# Patient Record
Sex: Female | Born: 1980
Health system: Southern US, Community
[De-identification: ages and names within clinical notes are randomized; demographics above are authoritative.]

## PROBLEM LIST (undated history)

## (undated) DIAGNOSIS — O1420 HELLP syndrome (HELLP), unspecified trimester: Secondary | ICD-10-CM

## (undated) DIAGNOSIS — B019 Varicella without complication: Secondary | ICD-10-CM

## (undated) DIAGNOSIS — J45909 Unspecified asthma, uncomplicated: Secondary | ICD-10-CM

## (undated) HISTORY — DX: HELLP syndrome (HELLP), unspecified trimester: O14.20

## (undated) HISTORY — DX: Unspecified asthma, uncomplicated: J45.909

## (undated) HISTORY — DX: Varicella without complication: B01.9

---

## 1999-03-14 ENCOUNTER — Other Ambulatory Visit: Admission: RE | Admit: 1999-03-14 | Discharge: 1999-03-14 | Payer: Self-pay | Admitting: Family Medicine

## 2000-03-19 ENCOUNTER — Other Ambulatory Visit: Admission: RE | Admit: 2000-03-19 | Discharge: 2000-03-19 | Payer: Self-pay | Admitting: Family Medicine

## 2001-03-25 ENCOUNTER — Other Ambulatory Visit: Admission: RE | Admit: 2001-03-25 | Discharge: 2001-03-25 | Payer: Self-pay | Admitting: *Deleted

## 2002-01-14 ENCOUNTER — Encounter: Payer: Self-pay | Admitting: Primary Care

## 2002-03-17 ENCOUNTER — Other Ambulatory Visit: Admission: RE | Admit: 2002-03-17 | Discharge: 2002-03-17 | Payer: Self-pay | Admitting: *Deleted

## 2003-04-07 ENCOUNTER — Other Ambulatory Visit: Admission: RE | Admit: 2003-04-07 | Discharge: 2003-04-07 | Payer: Self-pay | Admitting: *Deleted

## 2004-03-30 ENCOUNTER — Other Ambulatory Visit: Admission: RE | Admit: 2004-03-30 | Discharge: 2004-03-30 | Payer: Self-pay | Admitting: *Deleted

## 2005-03-13 ENCOUNTER — Other Ambulatory Visit: Admission: RE | Admit: 2005-03-13 | Discharge: 2005-03-13 | Payer: Self-pay | Admitting: *Deleted

## 2006-05-24 ENCOUNTER — Other Ambulatory Visit: Admission: RE | Admit: 2006-05-24 | Discharge: 2006-05-24 | Payer: Self-pay | Admitting: Obstetrics and Gynecology

## 2006-11-26 ENCOUNTER — Inpatient Hospital Stay (HOSPITAL_COMMUNITY): Admission: AD | Admit: 2006-11-26 | Discharge: 2006-11-30 | Payer: Self-pay | Admitting: Obstetrics and Gynecology

## 2006-12-01 ENCOUNTER — Encounter: Admission: RE | Admit: 2006-12-01 | Discharge: 2006-12-25 | Payer: Self-pay | Admitting: Obstetrics and Gynecology

## 2007-06-23 ENCOUNTER — Other Ambulatory Visit: Admission: RE | Admit: 2007-06-23 | Discharge: 2007-06-23 | Payer: Self-pay | Admitting: *Deleted

## 2008-09-10 ENCOUNTER — Other Ambulatory Visit: Admission: RE | Admit: 2008-09-10 | Discharge: 2008-09-10 | Payer: Self-pay | Admitting: Gynecology

## 2010-05-22 ENCOUNTER — Inpatient Hospital Stay (HOSPITAL_COMMUNITY): Admission: AD | Admit: 2010-05-22 | Discharge: 2010-05-24 | Payer: Self-pay | Admitting: Obstetrics and Gynecology

## 2010-05-23 ENCOUNTER — Encounter: Payer: Self-pay | Admitting: Obstetrics and Gynecology

## 2010-05-25 ENCOUNTER — Inpatient Hospital Stay: Admission: AD | Admit: 2010-05-25 | Discharge: 2010-05-25 | Payer: Self-pay | Admitting: Obstetrics and Gynecology

## 2010-06-08 ENCOUNTER — Encounter (INDEPENDENT_AMBULATORY_CARE_PROVIDER_SITE_OTHER): Payer: Self-pay | Admitting: Obstetrics and Gynecology

## 2010-06-08 ENCOUNTER — Inpatient Hospital Stay (HOSPITAL_COMMUNITY): Admission: AD | Admit: 2010-06-08 | Discharge: 2010-06-13 | Payer: Self-pay | Admitting: Obstetrics and Gynecology

## 2010-10-25 LAB — COMPREHENSIVE METABOLIC PANEL
ALT: 10 U/L (ref 0–35)
ALT: 19 U/L (ref 0–35)
ALT: 8 U/L (ref 0–35)
AST: 16 U/L (ref 0–37)
AST: 26 U/L (ref 0–37)
Albumin: 2.6 g/dL — ABNORMAL LOW (ref 3.5–5.2)
Albumin: 2.8 g/dL — ABNORMAL LOW (ref 3.5–5.2)
Alkaline Phosphatase: 107 U/L (ref 39–117)
Alkaline Phosphatase: 153 U/L — ABNORMAL HIGH (ref 39–117)
CO2: 25 mEq/L (ref 19–32)
Calcium: 8.2 mg/dL — ABNORMAL LOW (ref 8.4–10.5)
GFR calc Af Amer: >60 mL/min (ref >60)
GFR calc non Af Amer: >60 mL/min (ref >60)
Glucose, Bld: 107 mg/dL — ABNORMAL HIGH (ref 70–99)
Glucose, Bld: 87 mg/dL (ref 70–99)
Potassium: 4.3 mEq/L (ref 3.5–5.1)
Potassium: 4.5 mEq/L (ref 3.5–5.1)
Sodium: 130 mEq/L — ABNORMAL LOW (ref 135–145)
Sodium: 131 mEq/L — ABNORMAL LOW (ref 135–145)
Sodium: 135 mEq/L (ref 135–145)
Total Protein: 5.9 g/dL — ABNORMAL LOW (ref 6.0–8.3)
Total Protein: 6.4 g/dL (ref 6.0–8.3)

## 2010-10-25 LAB — CBC
HCT: 30 % — ABNORMAL LOW (ref 36.0–46.0)
HCT: 31.9 % — ABNORMAL LOW (ref 36.0–46.0)
HCT: 36.9 % (ref 36.0–46.0)
Hemoglobin: 10.1 g/dL — ABNORMAL LOW (ref 12.0–15.0)
MCH: 30.4 pg (ref 26.0–34.0)
MCHC: 33.8 g/dL (ref 30.0–36.0)
Platelets: 273 10*3/uL (ref 150–400)
RBC: 4.13 MIL/uL (ref 3.87–5.11)
RDW: 14.1 % (ref 11.5–15.5)
RDW: 14.1 % (ref 11.5–15.5)
WBC: 7.9 10*3/uL (ref 4.0–10.5)
WBC: 8.7 10*3/uL (ref 4.0–10.5)

## 2010-10-25 LAB — RPR: RPR Ser Ql: NONREACTIVE

## 2010-10-26 LAB — COMPREHENSIVE METABOLIC PANEL
ALT: 13 U/L (ref 0–35)
AST: 19 U/L (ref 0–37)
AST: 24 U/L (ref 0–37)
Albumin: 2.3 g/dL — ABNORMAL LOW (ref 3.5–5.2)
Albumin: 2.3 g/dL — ABNORMAL LOW (ref 3.5–5.2)
Alkaline Phosphatase: 110 U/L (ref 39–117)
Alkaline Phosphatase: 133 U/L — ABNORMAL HIGH (ref 39–117)
BUN: 6 mg/dL (ref 6–23)
BUN: 8 mg/dL (ref 6–23)
CO2: 21 mEq/L (ref 19–32)
CO2: 22 mEq/L (ref 19–32)
Calcium: 7.4 mg/dL — ABNORMAL LOW (ref 8.4–10.5)
Calcium: 9.4 mg/dL (ref 8.4–10.5)
Chloride: 106 mEq/L (ref 96–112)
Chloride: 107 mEq/L (ref 96–112)
Creatinine, Ser: 0.71 mg/dL (ref 0.4–1.2)
GFR calc Af Amer: >60 mL/min (ref >60)
GFR calc Af Amer: >60 mL/min (ref >60)
GFR calc non Af Amer: >60 mL/min (ref >60)
GFR calc non Af Amer: >60 mL/min (ref >60)
Glucose, Bld: 101 mg/dL — ABNORMAL HIGH (ref 70–99)
Potassium: 3.8 mEq/L (ref 3.5–5.1)
Sodium: 136 mEq/L (ref 135–145)
Total Bilirubin: 0.2 mg/dL — ABNORMAL LOW (ref 0.3–1.2)
Total Bilirubin: 0.4 mg/dL (ref 0.3–1.2)
Total Protein: 5.4 g/dL — ABNORMAL LOW (ref 6.0–8.3)

## 2010-10-26 LAB — URIC ACID
Uric Acid, Serum: 3.4 mg/dL (ref 2.4–7.0)
Uric Acid, Serum: 3.9 mg/dL (ref 2.4–7.0)

## 2010-10-26 LAB — COMPREHENSIVE METABOLIC PANEL WITH GFR
ALT: 14 U/L (ref 0–35)
AST: 20 U/L (ref 0–37)
Albumin: 2.5 g/dL — ABNORMAL LOW (ref 3.5–5.2)
Alkaline Phosphatase: 123 U/L — ABNORMAL HIGH (ref 39–117)
BUN: 6 mg/dL (ref 6–23)
CO2: 22 meq/L (ref 19–32)
Calcium: 7.8 mg/dL — ABNORMAL LOW (ref 8.4–10.5)
Chloride: 104 meq/L (ref 96–112)
Creatinine, Ser: 0.64 mg/dL (ref 0.4–1.2)
GFR calc non Af Amer: >60 mL/min
Glucose, Bld: 93 mg/dL (ref 70–99)
Potassium: 4 meq/L (ref 3.5–5.1)
Sodium: 133 meq/L — ABNORMAL LOW (ref 135–145)
Total Bilirubin: 0.4 mg/dL (ref 0.3–1.2)
Total Protein: 5.6 g/dL — ABNORMAL LOW (ref 6.0–8.3)

## 2010-10-26 LAB — CBC
HCT: 30.4 % — ABNORMAL LOW (ref 36.0–46.0)
HCT: 35.3 % — ABNORMAL LOW (ref 36.0–46.0)
HCT: 35.8 % — ABNORMAL LOW (ref 36.0–46.0)
Hemoglobin: 10.2 g/dL — ABNORMAL LOW (ref 12.0–15.0)
Hemoglobin: 10.3 g/dL — ABNORMAL LOW (ref 12.0–15.0)
Hemoglobin: 11.6 g/dL — ABNORMAL LOW (ref 12.0–15.0)
Hemoglobin: 11.9 g/dL — ABNORMAL LOW (ref 12.0–15.0)
MCH: 29.7 pg (ref 26.0–34.0)
MCH: 29.9 pg (ref 26.0–34.0)
MCH: 30.2 pg (ref 26.0–34.0)
MCHC: 32.8 g/dL (ref 30.0–36.0)
MCHC: 33.3 g/dL (ref 30.0–36.0)
MCHC: 33.5 g/dL (ref 30.0–36.0)
MCV: 89.4 fL (ref 78.0–100.0)
MCV: 90.2 fL (ref 78.0–100.0)
MCV: 91.3 fL (ref 78.0–100.0)
Platelets: 147 10*3/uL — ABNORMAL LOW (ref 150–400)
Platelets: 161 10*3/uL (ref 150–400)
Platelets: 190 10*3/uL (ref 150–400)
Platelets: 190 10*3/uL (ref 150–400)
RBC: 3.36 MIL/uL — ABNORMAL LOW (ref 3.87–5.11)
RBC: 3.37 MIL/uL — ABNORMAL LOW (ref 3.87–5.11)
RBC: 3.87 MIL/uL (ref 3.87–5.11)
RBC: 4.01 MIL/uL (ref 3.87–5.11)
RDW: 13.5 % (ref 11.5–15.5)
RDW: 13.5 % (ref 11.5–15.5)
RDW: 13.6 % (ref 11.5–15.5)
WBC: 11.9 10*3/uL — ABNORMAL HIGH (ref 4.0–10.5)
WBC: 13.6 10*3/uL — ABNORMAL HIGH (ref 4.0–10.5)
WBC: 13.6 10*3/uL — ABNORMAL HIGH (ref 4.0–10.5)
WBC: 9.2 10*3/uL (ref 4.0–10.5)

## 2010-10-26 LAB — CREATININE CLEARANCE, URINE, 24 HOUR
Creatinine Clearance: 144 mL/min — ABNORMAL HIGH (ref 75–115)
Creatinine, 24H Ur: 1325 mg/d (ref 700–1800)
Creatinine, Urine: 25 mg/dL
Creatinine: 0.64 mg/dL (ref 0.4–1.2)

## 2010-10-26 LAB — STREP B DNA PROBE: Strep Group B Ag: NEGATIVE

## 2010-10-26 LAB — LACTATE DEHYDROGENASE
LDH: 140 U/L (ref 94–250)
LDH: 92 U/L — ABNORMAL LOW (ref 94–250)

## 2010-10-26 LAB — PROTEIN, URINE, 24 HOUR: Urine Total Volume-UPROT: 5300 mL

## 2010-10-26 LAB — MAGNESIUM: Magnesium: 5.2 mg/dL — ABNORMAL HIGH (ref 1.5–2.5)

## 2010-10-26 LAB — SAMPLE TO BLOOD BANK

## 2010-12-29 NOTE — Discharge Summary (Signed)
Carolyn Watson, MARTORANO              ACCOUNT NO.:  [id number]   MEDICAL RECORD NO.:  [id number]          PATIENT TYPE:  INP   LOCATION:  9131                          FACILITY:  [facility]   PHYSICIAN:  Malachi Pro. Ambrose Mantle, M.D. DATE OF BIRTH:  05/24/1981   DATE OF ADMISSION:  11/26/2006  DATE OF DISCHARGE:  11/30/2006                               DISCHARGE SUMMARY   This is a 30 year old white female, para 0 gravida 1 at [redacted] weeks  gestation, [facility] Dec 26, 2006.  Last menstrual period compatible with a 9-  week ultrasound, admitted with preeclampsia, HELLP syndrome for  delivery.  The patient has had borderline blood pressures for the last  few weeks 140/90s but negative proteinuria.  Today noted increased  epigastric pain that actually began 3 days ago and a mild headache.  Blood pressure was 160/100 and 140/90 and 2+ proteinuria.  Labs at the  hospital were compatible with HELLP syndrome with platelets of 80,000  and liver function tests SGOT 217, SGPT 145.  Blood group and type A+,  antibody negative, RPR nonreactive, rubella nonimmune, hepatitis B  surface antigen negative, HIV and GC negative, chlamydia negative, group  B strep negative.   OBSTETRIC SURGICAL AN MEDICAL HISTORY:  Is negative.  Apparently she has  ALLERGIC ASTHMA .  She has no known drug allergies.  No medications.   On admission her blood pressure 160/102.  Fetal heart tones were  reassuring.  HEART/LUNGS:  Were normal.  ABDOMEN:  Was soft.  CERVIX: Was 1+ centimeters, 50%.  The patient was begun on magnesium sulfate for seizure prophylaxis and  Pitocin to induce labor.  By 9:30 p.m. the patient had some discomfort  with contractions.  Blood pressure was 160/105.  Fetal heart tones were  reassuring.  Dexamethasone was given.  By 11:15 p.m. the cervix was 2 cm  - 3 cm, 80% but the labs had deteriorated.  Her platelet count was  69,000, SGOT was 304, SGPT 182.  Dr. Senaida Ores offered the patient to  go for C-section and  she underwent a low transverse cervical C-section  by Dr. Senaida Ores under general anesthesia.  Blood loss about 700 cc,  living female infant 5 pounds 11 ounces with Apgars of 8 and 9 at 1 and  5 minutes was delivered.  Uterus, tubes and ovaries were normal.  Baby  was delivered at 11:58 p.m. on November 26, 2006.   Postoperatively the patient did well.  On the first postop day her SGPT  was 163, SGOT was 246, platelet count was 58,000.  Late on the first  postop day the patient required some labetalol for blood pressure,  magnesium sulfate was continued.  On the second postop day the patient  had diuresed well having put out 2900 cc.  On her last shift her blood  pressures 130-140/70-90s, liver function tests had decreased.  On the  third postop day the patient's SGOT was 48, SGPT 97.  The platelets were  94,000.  Her blood pressures were improved and she was ready for  discharge but the baby could not be discharged so elected  to stay.  On  the 4th postop day her blood pressures were fine.  She had no  significant complaints.  Her staples had already been removed, strips  applied and she was ready for discharge.   Her initial hemoglobin was 12.9, hematocrit 37.3, white count 10,800,  platelet count 86,000.  Follow-up hemoglobins were relatively stable.  The final hemoglobin was 9.9, platelet counts went from 86-80, 69, 58,  60, 46, 47 and 94.  Her SGOT and SGPT started at 122 and 137.  Follow-up  at 217, 145, 304, 182, 334 and 186, 246 and 163, 165, 159 and 48 and 97.  LDH was 207, 281, 326, 373, 335, 280 and 161.  Serum creatinine was  0.73, rose to a high of 0.84.  Uric acid remained within normal limits  the entire time.  Protein was 30 percent in the urine analysis.   FINAL DIAGNOSIS:  Intrauterine pregnancy at 36 weeks delivered vertex by  cesarean section, severe preeclampsia with HELLP syndrome.  Operation  low transverse cervical cesarean section.   FINAL CONDITION:   Improved.   INSTRUCTIONS:  Include our regular discharge instruction booklet.  Dr.  Senaida Ores has already given her prescriptions for ibuprofen and  Percocet and she is to return to the office in 1-2 weeks for an incision  check and blood pressure check.      Malachi Pro. Ambrose Mantle, M.D.  Electronically Signed     TFH/MEDQ  D:  11/30/2006  T:  11/30/2006  Job:  [id number]

## 2010-12-29 NOTE — Op Note (Signed)
Carolyn Watson, Carolyn Watson              ACCOUNT NO.:  [id number]   MEDICAL RECORD NO.:  [id number]          PATIENT TYPE:  INP   LOCATION:  9168                          [facility]:  [facility]   PHYSICIAN:  Huel Cote, M.D. DATE OF BIRTH:  [birth year]   DATE OF PROCEDURE:  11/27/2006  DATE OF DISCHARGE:                               OPERATIVE REPORT   PREOPERATIVE DIAGNOSES:  1. HILT syndrome preeclampsia.  2. Preterm pregnancy at 36 weeks.  3. Thrombocytopenia.   POSTOPERATIVE DIAGNOSES:  1. HILT syndrome preeclampsia.  2. Preterm pregnancy at 36 weeks.  3. Thrombocytopenia.   PROCEDURE:  Primary low transverse C-section with two-layer closure of  the uterus.   SURGEON:  Dr. Huel Cote.   ANESTHESIA:  General.   SPECIMENS:  Placenta was sent to labor and delivery.   ESTIMATED BLOOD LOSS:  700 mL.   IV FLUIDS:  1200 mL.   URINE OUTPUT:  Approximately 550 mL of clear urine.   FINDINGS:  There is a viable female infant in vertex presentation,  Apgars were 8 and 9, weight 5 pounds 11 ounces.  There is normal uterus,  tubes and ovaries noted.   PROCEDURE:  The patient was taken to the operating room after informed  consent was obtained and prepped and draped in the normal sterile  fashion in the dorsal supine position with a leftward tilt.  After a  Foley catheter was placed and the patient was prepped, she then  underwent induction with general anesthesia and an incision was begun on  the abdomen.  At this point, the patient moved slightly and it was felt  that her anesthetic was not deep enough. Therefore the operation was  halted and the anesthetic was improved.  Once this was found to be  adequate, we continued with the abdominal incision in Pfannenstiel  fashion and went down to the layer of fascia by Bovie cautery and sharp  dissection.  Fascia was then opened in the midline and the incision was  extended laterally with Mayo scissors.  The inferior aspect was  grasped  Kocher clamps, elevated and dissected off the rectus muscles, superior  aspect was elevated and dissected off the rectus muscles.  The rectus  muscles were separated midline and peritoneal incision extended both  superiorly and inferiorly with careful attention to avoid both bowel and  bladder.  The Alexis wound retractor was then placed within the incision  and lower uterine segment identified.  It was incised in transverse  fashion and the uterine cavity itself entered bluntly.  This was then  extended digitally and the infant's head was delivered atraumatically.  Nose and mouth were bulb suctioned.  The remainder of the infant  delivered, the cord clamped and infant handed to the waiting  pediatricians with a good spontaneous cry.  The placenta was then  expressed manually.  There was some mild uterine atony which responded  well to bimanual massage and Pitocin.  Uterus was cleared of all clots  and debris with moist lap sponge and the uterine incision was then  repaired in two layers, first layer a running  locked layer of 0-0  chromic, second an imbricating layer of the same suture.  At this point  there was no active bleeding noted.  The tubes and ovaries were  inspected and found to be hemostatic.  The bladder was inspected and  found to be hemostatic.  There were a couple of small areas of oozing  which were treated with Bovie cautery but no significant active bleeding  noted.  Therefore the abdomen and pelvis were irrigated and again all  appeared dry.  The rectus muscles and peritoneum were then  reapproximated with several interrupted sutures of 0-0 Vicryl.  The  fascia was closed with 0-0 Vicryl in a running fashion and skin was  closed with staples.  Sponge, lap, needle counts were correct x2 and the  patient was taken to the recovery room after she awakened in good  condition   OPERATIVE REPORT:  On the patient's of the more and thank you      Huel Cote,  M.D.  Electronically Signed     KR/MEDQ  D:  11/27/2006  T:  11/27/2006  Job:  [id number]

## 2012-12-26 ENCOUNTER — Other Ambulatory Visit: Payer: Self-pay | Admitting: Obstetrics and Gynecology

## 2013-11-10 ENCOUNTER — Other Ambulatory Visit: Payer: Self-pay | Admitting: Dermatology

## 2013-11-26 ENCOUNTER — Other Ambulatory Visit: Payer: Self-pay

## 2014-06-17 ENCOUNTER — Other Ambulatory Visit: Payer: Self-pay | Admitting: Obstetrics and Gynecology

## 2014-06-18 LAB — CYTOLOGY - PAP

## 2016-10-05 DIAGNOSIS — D2261 Melanocytic nevi of right upper limb, including shoulder: Secondary | ICD-10-CM | POA: Diagnosis not present

## 2016-10-05 DIAGNOSIS — D2262 Melanocytic nevi of left upper limb, including shoulder: Secondary | ICD-10-CM | POA: Diagnosis not present

## 2016-10-05 DIAGNOSIS — D225 Melanocytic nevi of trunk: Secondary | ICD-10-CM | POA: Diagnosis not present

## 2016-10-05 DIAGNOSIS — D2271 Melanocytic nevi of right lower limb, including hip: Secondary | ICD-10-CM | POA: Diagnosis not present

## 2016-12-11 LAB — HM PAP SMEAR: HM Pap smear: NORMAL

## 2016-12-25 DIAGNOSIS — Z6822 Body mass index (BMI) 22.0-22.9, adult: Secondary | ICD-10-CM | POA: Diagnosis not present

## 2016-12-25 DIAGNOSIS — Z01419 Encounter for gynecological examination (general) (routine) without abnormal findings: Secondary | ICD-10-CM | POA: Diagnosis not present

## 2017-10-30 DIAGNOSIS — Z131 Encounter for screening for diabetes mellitus: Secondary | ICD-10-CM | POA: Diagnosis not present

## 2017-10-30 DIAGNOSIS — Z1321 Encounter for screening for nutritional disorder: Secondary | ICD-10-CM | POA: Diagnosis not present

## 2017-10-30 DIAGNOSIS — Z13220 Encounter for screening for lipoid disorders: Secondary | ICD-10-CM | POA: Diagnosis not present

## 2017-10-30 DIAGNOSIS — Z1329 Encounter for screening for other suspected endocrine disorder: Secondary | ICD-10-CM | POA: Diagnosis not present

## 2017-11-22 ENCOUNTER — Ambulatory Visit: Payer: Self-pay | Admitting: Primary Care

## 2018-01-03 ENCOUNTER — Ambulatory Visit: Payer: Self-pay | Admitting: Primary Care

## 2018-01-24 ENCOUNTER — Encounter: Payer: Self-pay | Admitting: Primary Care

## 2018-01-24 ENCOUNTER — Ambulatory Visit: Payer: BLUE CROSS/BLUE SHIELD | Admitting: Primary Care

## 2018-01-24 VITALS — BP 116/70 | HR 70 | Temp 98.8°F | Ht 65.0 in | Wt 138.8 lb

## 2018-01-24 DIAGNOSIS — J4520 Mild intermittent asthma, uncomplicated: Secondary | ICD-10-CM | POA: Insufficient documentation

## 2018-01-24 DIAGNOSIS — J4540 Moderate persistent asthma, uncomplicated: Secondary | ICD-10-CM | POA: Diagnosis not present

## 2018-01-24 MED ORDER — FLUTICASONE PROPIONATE HFA 110 MCG/ACT IN AERO: 1.0000 | INHALATION_SPRAY | Freq: Two times a day (BID) | RESPIRATORY_TRACT | 5 refills | Status: DC

## 2018-01-24 NOTE — Assessment & Plan Note (Signed)
Well managed on ICS, using SABA infrequently. Will switch from Pulmicort to Flovent. Discussed to call if the dosing doesn't seem high enough. She will also call if the medication is too costly.  Exam today unremarkable.

## 2018-01-24 NOTE — Progress Notes (Signed)
Subjective:    Patient ID: Carolyn Watson, female    DOB: 1981/01/02, 37 y.o.   MRN: [id number]  HPI  Ms. Carolyn Watson is a 37 year old female who presents today to establish care and discuss the problems mentioned below. Will obtain old records. She follows annually with GYN for CPE. She underwent CPE recently and will fax those labs.   1) Asthma: Diagnosed in early 2000's. Currently managed on Pulmicort 180 mcg daily and albuterol HFA PRN. The Pulmicort inhaler is getting to be too costly and she'd like to discuss an alternative. She's using her albuterol 2-3 times monthly on average or during some exercise. The albuterol is affordable.   She denies wheezing, shortness of breath, persistent cough.    Review of Systems  Constitutional: Negative for fatigue.  Respiratory: Negative for chest tightness, shortness of breath and wheezing.   Cardiovascular: Negative for chest pain.  Neurological: Negative for dizziness and headaches.       Past Medical History:  Diagnosis Date  . Asthma   . Chickenpox   . HELLP syndrome      Social History   Socioeconomic History  . Marital status: Married    Spouse name: Not on file  . Number of children: Not on file  . Years of education: Not on file  . Highest education level: Not on file  Occupational History  . Not on file  Social Needs  . Financial resource strain: Not on file  . Food insecurity:    Worry: Not on file    Inability: Not on file  . Transportation needs:    Medical: Not on file    Non-medical: Not on file  Tobacco Use  . Smoking status: Former Games developermoker  . Smokeless tobacco: Never Used  Substance and Sexual Activity  . Alcohol use: Yes  . Drug use: Not on file  . Sexual activity: Not on file  Lifestyle  . Physical activity:    Days per week: Not on file    Minutes per session: Not on file  . Stress: Not on file  Relationships  . Social connections:    Talks on phone: Not on file    Gets together: Not on file   Attends religious service: Not on file    Active member of club or organization: Not on file    Attends meetings of clubs or organizations: Not on file    Relationship status: Not on file  . Intimate partner violence:    Fear of current or ex partner: Not on file    Emotionally abused: Not on file    Physically abused: Not on file    Forced sexual activity: Not on file  Other Topics Concern  . Not on file  Social History Narrative   Married.   3 children.   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   Enjoys cooking, spending time outdoors.    Past Surgical History:  Procedure Laterality Date  . CESAREAN SECTION  2008, 2011    Family History  Problem Relation Age of Onset  . Hyperlipidemia Mother   . Hypertension Mother   . Diabetes Father   . Hyperlipidemia Father   . Hypertension Father   . Hypertension Sister   . Miscarriages / Stillbirths Sister   . Cancer Maternal Grandmother [id number]       breast  . Diabetes Maternal Grandmother   . Heart disease Maternal Grandfather   . Hyperlipidemia Maternal Grandfather   . Hypertension Maternal  Grandfather   . Intellectual disability Maternal Grandfather     No Known Allergies  Current Outpatient Medications on File Prior to Visit  Medication Sig Dispense Refill  . Albuterol Sulfate (PROAIR HFA IN) Inhale 1 puff into the lungs as directed.     No current facility-administered medications on file prior to visit.     BP 116/70   Pulse 70   Temp 98.8 F (37.1 C) (Oral)   Ht 5\' 5"  (1.651 m)   Wt 138 lb 12 oz (62.9 kg)   LMP 01/08/2018   SpO2 98%   BMI 23.09 kg/m    Objective:   Physical Exam  Constitutional: She appears well-nourished.  Neck: Neck supple.  Cardiovascular: Normal rate and regular rhythm.  Respiratory: Effort normal and breath sounds normal. She has no wheezes.  Skin: Skin is warm and dry.  Psychiatric: She has a normal mood and affect.           Assessment & Plan:

## 2018-01-24 NOTE — Patient Instructions (Signed)
Start fluticasone 110 mcg inhaler. Inhale 1 puff into the lungs twice daily.  Please call me if this is too expensive and/or if the dose of the medication doesn't seem strong enough.  Please fax me those labs at your convenience.  It was a pleasure to meet you today! Please don't hesitate to call or message me with any questions. Welcome to [facility]!

## 2018-08-01 DIAGNOSIS — Z01419 Encounter for gynecological examination (general) (routine) without abnormal findings: Secondary | ICD-10-CM | POA: Diagnosis not present

## 2018-08-01 DIAGNOSIS — Z6823 Body mass index (BMI) 23.0-23.9, adult: Secondary | ICD-10-CM | POA: Diagnosis not present

## 2019-03-31 ENCOUNTER — Telehealth: Payer: Self-pay

## 2019-03-31 ENCOUNTER — Other Ambulatory Visit: Payer: Self-pay

## 2019-03-31 DIAGNOSIS — Z20822 Contact with and (suspected) exposure to covid-19: Secondary | ICD-10-CM

## 2019-03-31 DIAGNOSIS — U071 COVID-19: Secondary | ICD-10-CM

## 2019-03-31 NOTE — Telephone Encounter (Signed)
Spoken to patient and ok to order per Anda Kraft. Gave the info for testing site.

## 2019-03-31 NOTE — Telephone Encounter (Signed)
pts employer wants pt to have covid testing; today pt has temp of 100.5 and H/A;no other covid symptoms; no travel and no known exposure to covid. (pt does work in Teacher, music). Pt request cb when order is placed so she can go to [facility] over at [facility] for testing. Pt would like to go today.

## 2019-04-01 LAB — NOVEL CORONAVIRUS, NAA: SARS-CoV-2, NAA: DETECTED — AB

## 2019-05-22 DIAGNOSIS — Z1231 Encounter for screening mammogram for malignant neoplasm of breast: Secondary | ICD-10-CM | POA: Diagnosis not present

## 2019-05-26 ENCOUNTER — Other Ambulatory Visit: Payer: Self-pay | Admitting: Obstetrics and Gynecology

## 2019-05-26 DIAGNOSIS — R928 Other abnormal and inconclusive findings on diagnostic imaging of breast: Secondary | ICD-10-CM

## 2019-05-27 ENCOUNTER — Other Ambulatory Visit: Payer: Self-pay

## 2019-05-27 ENCOUNTER — Ambulatory Visit
Admission: RE | Admit: 2019-05-27 | Discharge: 2019-05-27 | Disposition: A | Discharge status: Home / Self Care | Payer: BLUE CROSS/BLUE SHIELD | Source: Ambulatory Visit | Attending: Obstetrics and Gynecology | Admitting: Obstetrics and Gynecology

## 2019-05-27 ENCOUNTER — Ambulatory Visit
Admission: RE | Admit: 2019-05-27 | Discharge: 2019-05-27 | Disposition: A | Discharge status: Home / Self Care | Payer: BC Managed Care – PPO | Source: Ambulatory Visit | Attending: Obstetrics and Gynecology | Admitting: Obstetrics and Gynecology

## 2019-05-27 DIAGNOSIS — N6001 Solitary cyst of right breast: Secondary | ICD-10-CM | POA: Diagnosis not present

## 2019-05-27 DIAGNOSIS — R928 Other abnormal and inconclusive findings on diagnostic imaging of breast: Secondary | ICD-10-CM

## 2019-05-27 DIAGNOSIS — R922 Inconclusive mammogram: Secondary | ICD-10-CM | POA: Diagnosis not present

## 2019-07-30 DIAGNOSIS — Z1159 Encounter for screening for other viral diseases: Secondary | ICD-10-CM | POA: Diagnosis not present

## 2019-10-06 DIAGNOSIS — Z1159 Encounter for screening for other viral diseases: Secondary | ICD-10-CM | POA: Diagnosis not present

## 2020-02-29 DIAGNOSIS — N6452 Nipple discharge: Secondary | ICD-10-CM | POA: Diagnosis not present

## 2020-07-04 DIAGNOSIS — Z1231 Encounter for screening mammogram for malignant neoplasm of breast: Secondary | ICD-10-CM | POA: Diagnosis not present

## 2020-07-04 LAB — HM MAMMOGRAPHY

## 2020-07-26 DIAGNOSIS — Z1159 Encounter for screening for other viral diseases: Secondary | ICD-10-CM | POA: Diagnosis not present

## 2020-11-07 ENCOUNTER — Telehealth: Payer: Self-pay

## 2020-11-07 NOTE — Telephone Encounter (Signed)
[facility] Day - Client TELEPHONE ADVICE RECORD [facility] Patient Name: Carolyn Watson Gender: Female DOB: 1981/05/23 Age: 40 Y 7 M 8 D Return Phone Number: [phone] (Primary) Address: City/State/Zip: [place] [address] Client [facility] Day - Client Client Site [facility] - Day Physician Vernona Rieger - NP Contact Type Call Who Is Calling Patient / Member / Family / Caregiver Call Type Triage / Clinical Relationship To Patient Self Return Phone Number [phone] (Primary) Chief Complaint Headache Reason for Call Symptomatic / Request for Health Information Initial Comment Caller states she has had a headache for 7 days. Her BP is around 152/98. She would like to have a call back at 4:30. Translation No Nurse Assessment Nurse: Kizzie Bane, RN, Marylene Land Date/Time Lamount Cohen Time): 11/07/2020 4:03:07 PM Confirm and document reason for call. If symptomatic, describe symptoms. ---Caller states she has had a headache for the last 7 days. Advil helps for a little while. Her blood pressure was 152/98 at noon today Does the patient have any new or worsening symptoms? ---Yes Will a triage be completed? ---Yes Related visit to physician within the last 2 weeks? ---No Does the PT have any chronic conditions? (i.e. diabetes, asthma, this includes High risk factors for pregnancy, etc.) ---Yes List chronic conditions. ---asthma Is the patient pregnant or possibly pregnant? (Ask all females between the ages of 32-55) ---No Is this a behavioral health or substance abuse call? ---No Guidelines Guideline Title Affirmed Question Affirmed Notes Nurse Date/Time (Eastern Time) Headache [1] MODERATE headache (e.g., interferes with normal activities) AND [2] present > 24 hours AND [3] unexplained (Exceptions: analgesics not tried, typical migraine, or headache part of viral illness) Kizzie Bane, RN, Marylene Land 11/07/2020  4:05:15 PM PLEASE NOTE: All timestamps contained within this report are represented as [place] Standard Time. CONFIDENTIALTY NOTICE: This fax transmission is intended only for the addressee. It contains information that is legally privileged, confidential or otherwise protected from use or disclosure. If you are not the intended recipient, you are strictly prohibited from reviewing, disclosing, copying using or disseminating any of this information or taking any action in reliance on or regarding this information. If you have received this fax in error, please notify us immediately by telephone so that we can arrange for its return to [place]. Phone: [phone], Toll-Free: [phone], Fax: [phone] Page: 2 of 2 Call Id: [id number] Disp. Time Lamount Cohen Time) Disposition Final User 11/07/2020 3:53:02 PM Attempt made - message left Leanord Hawking 11/07/2020 4:08:29 PM See PCP within 24 Hours Yes Kizzie Bane, RN, Rosalyn Charters Disagree/Comply Comply Caller Understands Yes PreDisposition Did not know what to do Care Advice Given Per Guideline SEE PCP WITHIN 24 HOURS: LOCAL COLD: * Apply a cold wet washcloth or cold pack to the forehead for 20 minutes. CALL BACK IF: * You become worse CARE ADVICE given per Headache (Adult) guideline. Referrals REFERRED TO PCP OFFICE

## 2020-11-07 NOTE — Telephone Encounter (Signed)
Pt already has in office appt with Allayne Gitelman NP on 11/08/20 at 10:20. 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 Sending note to Allayne Gitelman NP.

## 2020-11-08 ENCOUNTER — Encounter: Payer: Self-pay | Admitting: Primary Care

## 2020-11-08 ENCOUNTER — Other Ambulatory Visit: Payer: Self-pay

## 2020-11-08 ENCOUNTER — Ambulatory Visit: Payer: BC Managed Care – PPO | Admitting: Primary Care

## 2020-11-08 DIAGNOSIS — I10 Essential (primary) hypertension: Secondary | ICD-10-CM | POA: Diagnosis not present

## 2020-11-08 DIAGNOSIS — J4540 Moderate persistent asthma, uncomplicated: Secondary | ICD-10-CM

## 2020-11-08 MED ORDER — HYDROCHLOROTHIAZIDE 12.5 MG PO TABS: 12.5000 mg | ORAL_TABLET | Freq: Every day | ORAL | 0 refills | Status: DC

## 2020-11-08 NOTE — Assessment & Plan Note (Signed)
No prior diagnosis but strong FH.  Elevated readings over the last several days with constant headache.   Given FH coupled with symptoms and elevated readings, will treat with HCTZ 12.5 mg once daily. Rx sent to pharmacy.  We will plan to see her back in 2-3 weeks for BP check and BMP.

## 2020-11-08 NOTE — Patient Instructions (Signed)
Start hydrochlorothiazide 12.5 mg once daily for blood pressure.  Please schedule a follow up visit to meet back with me in 2-3 weeks for blood pressure check.    It was a pleasure to see you today!

## 2020-11-08 NOTE — Assessment & Plan Note (Addendum)
Stable on Pulmicort for which she's using once daily, prescribed by OB/GYN but okay if we take over.  Using albuterol with heavy cardio work out days in the gym.   Continue Pulmicort daily and PRN albuterol. I am happy to take over this RX.

## 2020-11-08 NOTE — Telephone Encounter (Signed)
Patient evaluated today

## 2020-11-08 NOTE — Progress Notes (Signed)
Subjective:    Patient ID: Carolyn Watson, female    DOB: [birth year], 40 y.o.   MRN: [id number]  HPI  Carolyn Watson is a very pleasant 40 y.o. female without a history of hypertension who presents today to discuss elevated blood pressure readings and follow up. She has not been seen in our office since June 2019.  She does have a strong FH of hypertension in both parents and sister. She has a history of HEELP syndrome.   She phone our office yesterday with reports of a 7 day history of headache with BP at 152/98.   Today she's been checking her BP over the last several days which is 140/92, 152/98, 140/96. Her headache is located to the right upper occipital lobe.  She endorses a healthy diet, is exercising six days weekly. She denies blurred, chest pain, dizziness, history of migraines.   Doing well on pulmicort for asthma, uses this once daily and feels well managed. Using albuterol on during heavy cardio work out days at [employer]   BP Readings from Last 3 Encounters:  11/08/20 (!) 134/98  01/24/18 116/70      Review of Systems  Eyes: Negative for visual disturbance.  Respiratory: Negative for shortness of breath.   Cardiovascular: Negative for chest pain.  Neurological: Positive for headaches. Negative for dizziness.         Past Medical History:  Diagnosis Date  . Asthma   . Chickenpox   . HELLP syndrome     Social History   Socioeconomic History  . Marital status: Married    Spouse name: Not on file  . Number of children: Not on file  . Years of education: Not on file  . Highest education level: Not on file  Occupational History  . Not on file  Tobacco Use  . Smoking status: Former Games developer  . Smokeless tobacco: Never Used  Substance and Sexual Activity  . Alcohol use: Yes  . Drug use: Not on file  . Sexual activity: Not on file  Other Topics Concern  . Not on file  Social History Narrative   Married.   3 children.   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   Enjoys cooking, spending time outdoors.   Social Determinants of Health   Financial Resource Strain: Not on file  Food Insecurity: Not on file  Transportation Needs: Not on file  Physical Activity: Not on file  Stress: Not on file  Social Connections: Not on file  Intimate Partner Violence: Not on file    Past Surgical History:  Procedure Laterality Date  . CESAREAN SECTION  2008, 2011    Family History  Problem Relation Age of Onset  . Hyperlipidemia Mother   . Hypertension Mother   . Diabetes Father   . Hyperlipidemia Father   . Hypertension Father   . Hypertension Sister   . Miscarriages / Stillbirths Sister   . Cancer Maternal Grandmother 13       breast  . Diabetes Maternal Grandmother   . Heart disease Maternal Grandfather   . Hyperlipidemia Maternal Grandfather   . Hypertension Maternal Grandfather   . Intellectual disability Maternal Grandfather     No Known Allergies  Current Outpatient Medications on File Prior to Visit  Medication Sig Dispense Refill  . Albuterol Sulfate (PROAIR HFA IN) Inhale 1 puff into the lungs as directed.    . budesonide (PULMICORT) 180 MCG/ACT inhaler Inhale 1 puff into the lungs daily.  No current facility-administered medications on file prior to visit.    BP (!) 134/98   Pulse 86   Temp 98 F (36.7 C) (Temporal)   Ht 5\' 5"  (1.651 m)   Wt 150 lb (68 kg)   SpO2 98%   BMI 24.96 kg/m  Objective:   Physical Exam Cardiovascular:     Rate and Rhythm: Normal rate and regular rhythm.  Pulmonary:     Effort: Pulmonary effort is normal.     Breath sounds: Normal breath sounds.  Musculoskeletal:     Cervical back: Neck supple.  Skin:    General: Skin is warm and dry.           Assessment & Plan:      This visit occurred during the SARS-CoV-2 public health emergency.  Safety protocols were in place, including screening questions prior to the visit, additional usage of staff PPE, and extensive  cleaning of exam room while observing appropriate contact time as indicated for disinfecting solutions.

## 2020-11-15 DIAGNOSIS — Z6824 Body mass index (BMI) 24.0-24.9, adult: Secondary | ICD-10-CM | POA: Diagnosis not present

## 2020-11-15 DIAGNOSIS — Z01419 Encounter for gynecological examination (general) (routine) without abnormal findings: Secondary | ICD-10-CM | POA: Diagnosis not present

## 2020-11-15 LAB — HM PAP SMEAR

## 2020-11-15 LAB — RESULTS CONSOLE HPV: CHL HPV: NEGATIVE

## 2020-11-17 ENCOUNTER — Encounter: Payer: Self-pay | Admitting: Primary Care

## 2020-11-30 ENCOUNTER — Other Ambulatory Visit: Payer: Self-pay | Admitting: Primary Care

## 2020-11-30 DIAGNOSIS — I10 Essential (primary) hypertension: Secondary | ICD-10-CM

## 2020-12-02 ENCOUNTER — Other Ambulatory Visit: Payer: Self-pay

## 2020-12-02 ENCOUNTER — Ambulatory Visit: Payer: BC Managed Care – PPO | Admitting: Primary Care

## 2020-12-02 ENCOUNTER — Encounter: Payer: Self-pay | Admitting: Primary Care

## 2020-12-02 DIAGNOSIS — I10 Essential (primary) hypertension: Secondary | ICD-10-CM

## 2020-12-02 LAB — COMPREHENSIVE METABOLIC PANEL
ALT: 12 U/L (ref 0–35)
AST: 17 U/L (ref 0–37)
Albumin: 4.2 g/dL (ref 3.5–5.2)
Alkaline Phosphatase: 44 U/L (ref 39–117)
BUN: 18 mg/dL (ref 6–23)
CO2: 28 mEq/L (ref 19–32)
Calcium: 9.5 mg/dL (ref 8.4–10.5)
Chloride: 101 mEq/L (ref 96–112)
Creatinine, Ser: 0.99 mg/dL (ref 0.40–1.20)
GFR: 70.75 mL/min (ref >60.00)
Glucose, Bld: 95 mg/dL (ref 70–99)
Potassium: 4.7 mEq/L (ref 3.5–5.1)
Sodium: 136 mEq/L (ref 135–145)
Total Bilirubin: 0.4 mg/dL (ref 0.2–1.2)
Total Protein: 7.3 g/dL (ref 6.0–8.3)

## 2020-12-02 LAB — LIPID PANEL
Cholesterol: 169 mg/dL (ref 0–200)
HDL: 57.2 mg/dL (ref >39.00)
LDL Cholesterol: 99 mg/dL (ref 0–99)
NonHDL: 112.19
Total CHOL/HDL Ratio: 3
Triglycerides: 64 mg/dL (ref 0.0–149.0)
VLDL: 12.8 mg/dL (ref 0.0–40.0)

## 2020-12-02 MED ORDER — HYDROCHLOROTHIAZIDE 12.5 MG PO TABS: 12.5000 mg | ORAL_TABLET | Freq: Every day | ORAL | 3 refills | Status: DC

## 2020-12-02 NOTE — Progress Notes (Signed)
Subjective:    Patient ID: Carolyn Watson, female    DOB: 1981/03/22, 40 y.o.   MRN: [id number]  HPI  Carolyn Watson is a very pleasant 40 y.o. female with a history of hypertension who presents today for follow up of hypertension.  She was last evaluated on 11/08/20 with elevated blood pressure readings, had not been seen since June 2019. During this visit BP was running in the 140's-150's/90's. Also with symptoms of headaches. Given her readings and symptoms we initiated HCTZ 12.5 mg and asked her to come back today.  Since her last visit her headaches have resolved. She denies dizziness, chest pain, blurred vision. She is needing refills today. She is checking her BP at home which is running 120's/80's-90's.   BP Readings from Last 3 Encounters:  12/02/20 124/88  11/08/20 (!) 134/98  01/24/18 116/70        Review of Systems  Eyes: Negative for visual disturbance.  Respiratory: Negative for shortness of breath.   Cardiovascular: Negative for chest pain.  Neurological: Negative for headaches.         Past Medical History:  Diagnosis Date  . Asthma   . Chickenpox   . HELLP syndrome     Social History   Socioeconomic History  . Marital status: Married    Spouse name: Not on file  . Number of children: Not on file  . Years of education: Not on file  . Highest education level: Not on file  Occupational History  . Not on file  Tobacco Use  . Smoking status: Former Games developer  . Smokeless tobacco: Never Used  Substance and Sexual Activity  . Alcohol use: Yes  . Drug use: Not on file  . Sexual activity: Not on file  Other Topics Concern  . Not on file  Social History Narrative   Married.   3 children.   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   Enjoys cooking, spending time outdoors.   Social Determinants of Health   Financial Resource Strain: Not on file  Food Insecurity: Not on file  Transportation Needs: Not on file  Physical Activity: Not on file   Stress: Not on file  Social Connections: Not on file  Intimate Partner Violence: Not on file    Past Surgical History:  Procedure Laterality Date  . CESAREAN SECTION  2008, 2011    Family History  Problem Relation Age of Onset  . Hyperlipidemia Mother   . Hypertension Mother   . Diabetes Father   . Hyperlipidemia Father   . Hypertension Father   . Hypertension Sister   . Miscarriages / Stillbirths Sister   . Cancer Maternal Grandmother 68       breast  . Diabetes Maternal Grandmother   . Heart disease Maternal Grandfather   . Hyperlipidemia Maternal Grandfather   . Hypertension Maternal Grandfather   . Intellectual disability Maternal Grandfather     No Known Allergies  Current Outpatient Medications on File Prior to Visit  Medication Sig Dispense Refill  . Albuterol Sulfate (PROAIR HFA IN) Inhale 1 puff into the lungs as directed.    . budesonide (PULMICORT) 180 MCG/ACT inhaler Inhale 1 puff into the lungs daily.    . hydrochlorothiazide (HYDRODIURIL) 12.5 MG tablet Take 1 tablet (12.5 mg total) by mouth daily. For blood pressure. 30 tablet 0   No current facility-administered medications on file prior to visit.    BP 124/88   Pulse 74   Temp 98.1 F (36.7  C) (Temporal)   Ht 5\' 5"  (1.651 m)   Wt 149 lb (67.6 kg)   SpO2 98%   BMI 24.79 kg/m  Objective:   Physical Exam Cardiovascular:     Rate and Rhythm: Normal rate and regular rhythm.  Pulmonary:     Effort: Pulmonary effort is normal.     Breath sounds: Normal breath sounds.  Musculoskeletal:     Cervical back: Neck supple.  Skin:    General: Skin is warm and dry.           Assessment & Plan:      This visit occurred during the SARS-CoV-2 public health emergency.  Safety protocols were in place, including screening questions prior to the visit, additional usage of staff PPE, and extensive cleaning of exam room while observing appropriate contact time as indicated for disinfecting solutions.

## 2020-12-02 NOTE — Patient Instructions (Signed)
Stop by the lab prior to leaving today. I will notify you of your results once received.   It was a pleasure to see you today!  

## 2020-12-02 NOTE — Assessment & Plan Note (Signed)
Improved and at goal on HCTZ 12.5 mg.  CMP pending.  Refills provided.

## 2020-12-12 ENCOUNTER — Encounter: Payer: Self-pay | Admitting: Primary Care

## 2021-08-21 DIAGNOSIS — Z1231 Encounter for screening mammogram for malignant neoplasm of breast: Secondary | ICD-10-CM | POA: Diagnosis not present

## 2021-10-02 ENCOUNTER — Ambulatory Visit (INDEPENDENT_AMBULATORY_CARE_PROVIDER_SITE_OTHER)
Admission: RE | Admit: 2021-10-02 | Discharge: 2021-10-02 | Disposition: A | Discharge status: Home / Self Care | Payer: BC Managed Care – PPO | Source: Ambulatory Visit | Attending: Family | Admitting: Family

## 2021-10-02 ENCOUNTER — Other Ambulatory Visit: Payer: Self-pay

## 2021-10-02 ENCOUNTER — Encounter: Payer: Self-pay | Admitting: Family

## 2021-10-02 ENCOUNTER — Ambulatory Visit: Payer: BC Managed Care – PPO | Admitting: Family

## 2021-10-02 VITALS — BP 148/82 | HR 92 | Ht 64.0 in | Wt 150.0 lb

## 2021-10-02 DIAGNOSIS — M62838 Other muscle spasm: Secondary | ICD-10-CM | POA: Insufficient documentation

## 2021-10-02 DIAGNOSIS — I10 Essential (primary) hypertension: Secondary | ICD-10-CM

## 2021-10-02 DIAGNOSIS — M545 Low back pain, unspecified: Secondary | ICD-10-CM | POA: Diagnosis not present

## 2021-10-02 HISTORY — DX: Low back pain, unspecified: M54.50

## 2021-10-02 MED ORDER — IBUPROFEN 600 MG PO TABS: 600.0000 mg | ORAL_TABLET | Freq: Three times a day (TID) | ORAL | 0 refills | Status: AC | PRN

## 2021-10-02 MED ORDER — KETOROLAC TROMETHAMINE 60 MG/2ML IM SOLN
60.0000 mg | Freq: Once | INTRAMUSCULAR | Status: AC
  Administered 2021-10-02: 60 mg via INTRAMUSCULAR

## 2021-10-02 MED ORDER — METHOCARBAMOL 500 MG PO TABS: ORAL_TABLET | ORAL | 0 refills | Status: DC

## 2021-10-02 NOTE — Assessment & Plan Note (Signed)
Robaxin heat to site.

## 2021-10-02 NOTE — Assessment & Plan Note (Signed)
Pt advised of the following:  Continue medication as prescribed. Monitor blood pressure periodically and/or when you feel symptomatic. Goal is <130/90 on average. Ensure that you have rested for 30 minutes prior to checking your blood pressure. Record your readings and bring them to your next visit if necessary.work on a low sodium diet. If consistently over 140/90 when pain is managed please follow up with pcp

## 2021-10-02 NOTE — Progress Notes (Signed)
Established Patient Office Visit  Subjective:  Patient ID: Carolyn Watson, female    DOB: [birth year]  Age: 41 y.o. MRN: [id number]  CC:  Chief Complaint  Patient presents with   Back Pain    Pt stated--lower back pain, stiff especially when sitting then standing--3 days. Denied fever/injury.    HPI Carolyn Watson is here today with concerns.   Three days ago started with lower back pain, bil that is tensing up and contracting. Intermittent. Worse with coming up from a sitting position to standing. If lying down and  stable, feels better or standing up feels better.   No urinary symptoms. No flank pain.  No numbness/ tingling between the legs.  Hsa tried robaxin last night and this am which has helped slightly but anytime she moves side to side and or twists, has pain. Has tried heat and ice pad no relief.   Past Medical History:  Diagnosis Date   Asthma    Chickenpox    HELLP syndrome     Past Surgical History:  Procedure Laterality Date   CESAREAN SECTION  2008, 2011    Family History  Problem Relation Age of Onset   Hyperlipidemia Mother    Hypertension Mother    Diabetes Father    Hyperlipidemia Father    Hypertension Father    Hypertension Sister    Miscarriages / Stillbirths Sister    Cancer Maternal Grandmother 68       breast   Diabetes Maternal Grandmother    Heart disease Maternal Grandfather    Hyperlipidemia Maternal Grandfather    Hypertension Maternal Grandfather    Intellectual disability Maternal Grandfather     Social History   Socioeconomic History   Marital status: Married    Spouse name: Not on file   Number of children: Not on file   Years of education: Not on file   Highest education level: Not on file  Occupational History   Not on file  Tobacco Use   Smoking status: Former   Smokeless tobacco: Never  Substance and Sexual Activity   Alcohol use: Yes   Drug use: Not on file   Sexual activity: Not on file  Other Topics  Concern   Not on file  Social History Narrative   Married.   3 children.   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   Enjoys cooking, spending time outdoors.   Social Determinants of Health   Financial Resource Strain: Not on file  Food Insecurity: Not on file  Transportation Needs: Not on file  Physical Activity: Not on file  Stress: Not on file  Social Connections: Not on file  Intimate Partner Violence: Not on file    Outpatient Medications Prior to Visit  Medication Sig Dispense Refill   Albuterol Sulfate (PROAIR HFA IN) Inhale 1 puff into the lungs as directed.     budesonide (PULMICORT) 180 MCG/ACT inhaler Inhale 1 puff into the lungs daily.     hydrochlorothiazide (HYDRODIURIL) 12.5 MG tablet Take 1 tablet (12.5 mg total) by mouth daily. For blood pressure. 90 tablet 3   No facility-administered medications prior to visit.    No Known Allergies  ROS Review of Systems  Constitutional:  Negative for chills and fever.  Respiratory:  Negative for cough, shortness of breath and wheezing.   Cardiovascular:  Negative for chest pain and palpitations.  Genitourinary:  Negative for decreased urine volume, difficulty urinating, dysuria, flank pain, frequency, menstrual problem, pelvic pain and urgency.  Musculoskeletal:  Positive for back pain.     Objective:    Physical Exam Constitutional:      General: She is not in acute distress.    Appearance: Normal appearance. She is normal weight. She is not ill-appearing, toxic-appearing or diaphoretic.  HENT:     Head: Normocephalic.  Cardiovascular:     Rate and Rhythm: Normal rate and regular rhythm.  Pulmonary:     Effort: Pulmonary effort is normal.  Musculoskeletal:     Cervical back: Normal.     Thoracic back: Normal.     Lumbar back: Spasms (tightness right hip) and tenderness (tenderness on ROM with all range of motion) present. No swelling or edema. Decreased range of motion. Negative right straight leg raise test  and negative left straight leg raise test.  Skin:    General: Skin is warm.  Neurological:     General: No focal deficit present.     Mental Status: She is alert and oriented to person, place, and time.    BP (!) 148/82    Pulse 92    Ht 5\' 4"  (1.626 m)    Wt 150 lb (68 kg)    SpO2 98%    BMI 25.75 kg/m  Wt Readings from Last 3 Encounters:  10/02/21 150 lb (68 kg)  12/02/20 149 lb (67.6 kg)  11/08/20 150 lb (68 kg)     Health Maintenance Due  Topic Date Due   HIV Screening  Never done   COVID-19 Vaccine (4 - Booster for Pfizer series) 11/04/2020   INFLUENZA VACCINE  03/13/2021    There are no preventive care reminders to display for this patient.  No results found for: TSH Lab Results  Component Value Date   WBC 8.7 06/12/2010   HGB 10.6 (L) 06/12/2010   HCT 31.9 (L) 06/12/2010   MCV 89.5 06/12/2010   PLT 273 06/12/2010   Lab Results  Component Value Date   NA 136 12/02/2020   K 4.7 12/02/2020   CO2 28 12/02/2020   GLUCOSE 95 12/02/2020   BUN 18 12/02/2020   CREATININE 0.99 12/02/2020   BILITOT 0.4 12/02/2020   ALKPHOS 44 12/02/2020   AST 17 12/02/2020   ALT 12 12/02/2020   PROT 7.3 12/02/2020   ALBUMIN 4.2 12/02/2020   CALCIUM 9.5 12/02/2020   GFR 70.75 12/02/2020   No results found for: HGBA1C    Assessment & Plan:   Problem List Items Addressed This Visit       Cardiovascular and Mediastinum   Primary hypertension    Pt advised of the following:  Continue medication as prescribed. Monitor blood pressure periodically and/or when you feel symptomatic. Goal is <130/90 on average. Ensure that you have rested for 30 minutes prior to checking your blood pressure. Record your readings and bring them to your next visit if necessary.work on a low sodium diet. If consistently over 140/90 when pain is managed please follow up with pcp        Other   Muscle spasm    Robaxin heat to site.       Relevant Medications   ibuprofen (ADVIL) 600 MG tablet    Acute bilateral low back pain without sciatica - Primary    toradol 60 mg IM in office. Pt tolerated well. Verbal consent obtained prior to administration.  ibuprofen 600 mg and robaxin sent to pharmacy prn. Pt to wake 6 hours prior to taking another ibuprofen since toradol.   Xray lumbar pending as  recurrent, if needed pt to refer to [facility].  Heat ice as tolerated.      Relevant Medications   ibuprofen (ADVIL) 600 MG tablet   methocarbamol (ROBAXIN) 500 MG tablet   Other Relevant Orders   DG Lumbar Spine Complete    Meds ordered this encounter  Medications   ketorolac (TORADOL) injection 60 mg   ibuprofen (ADVIL) 600 MG tablet    Sig: Take 1 tablet (600 mg total) by mouth every 8 (eight) hours as needed for moderate pain.    Dispense:  30 tablet    Refill:  0    Order Specific Question:   Supervising Provider    Answer:   BEDSOLE, AMY E [[id number]]   methocarbamol (ROBAXIN) 500 MG tablet    Sig: Take one po qid prn muscle spasm    Dispense:  28 tablet    Refill:  0    Order Specific Question:   Supervising Provider    Answer:   BEDSOLE, AMY E [[id number]]    Follow-up: No follow-ups on file.    Mort Sawyers, FNP

## 2021-10-02 NOTE — Assessment & Plan Note (Signed)
toradol 60 mg IM in office. Pt tolerated well. Verbal consent obtained prior to administration.  ibuprofen 600 mg and robaxin sent to pharmacy prn. Pt to wake 6 hours prior to taking another ibuprofen since toradol.   Xray lumbar pending as recurrent, if needed pt to refer to [facility].  Heat ice as tolerated.

## 2021-10-03 ENCOUNTER — Encounter: Payer: Self-pay | Admitting: Family

## 2021-10-03 ENCOUNTER — Other Ambulatory Visit: Payer: Self-pay | Admitting: Family

## 2021-10-03 DIAGNOSIS — M2578 Osteophyte, vertebrae: Secondary | ICD-10-CM | POA: Insufficient documentation

## 2021-10-03 DIAGNOSIS — G8929 Other chronic pain: Secondary | ICD-10-CM | POA: Insufficient documentation

## 2021-10-03 DIAGNOSIS — M545 Low back pain, unspecified: Secondary | ICD-10-CM

## 2021-10-04 DIAGNOSIS — M5416 Radiculopathy, lumbar region: Secondary | ICD-10-CM | POA: Diagnosis not present

## 2021-10-04 DIAGNOSIS — M545 Low back pain, unspecified: Secondary | ICD-10-CM | POA: Diagnosis not present

## 2021-11-16 DIAGNOSIS — M545 Low back pain, unspecified: Secondary | ICD-10-CM | POA: Diagnosis not present

## 2021-11-16 DIAGNOSIS — M533 Sacrococcygeal disorders, not elsewhere classified: Secondary | ICD-10-CM | POA: Diagnosis not present

## 2021-12-08 DIAGNOSIS — M6281 Muscle weakness (generalized): Secondary | ICD-10-CM | POA: Diagnosis not present

## 2021-12-08 DIAGNOSIS — M545 Low back pain, unspecified: Secondary | ICD-10-CM | POA: Diagnosis not present

## 2021-12-15 DIAGNOSIS — M545 Low back pain, unspecified: Secondary | ICD-10-CM | POA: Diagnosis not present

## 2021-12-15 DIAGNOSIS — Z01419 Encounter for gynecological examination (general) (routine) without abnormal findings: Secondary | ICD-10-CM | POA: Diagnosis not present

## 2021-12-15 DIAGNOSIS — M6281 Muscle weakness (generalized): Secondary | ICD-10-CM | POA: Diagnosis not present

## 2021-12-15 DIAGNOSIS — Z6824 Body mass index (BMI) 24.0-24.9, adult: Secondary | ICD-10-CM | POA: Diagnosis not present

## 2021-12-26 DIAGNOSIS — M6281 Muscle weakness (generalized): Secondary | ICD-10-CM | POA: Diagnosis not present

## 2021-12-26 DIAGNOSIS — M545 Low back pain, unspecified: Secondary | ICD-10-CM | POA: Diagnosis not present

## 2022-01-04 ENCOUNTER — Ambulatory Visit: Payer: BC Managed Care – PPO | Admitting: Primary Care

## 2022-01-05 ENCOUNTER — Telehealth: Payer: Self-pay | Admitting: Primary Care

## 2022-01-05 DIAGNOSIS — I10 Essential (primary) hypertension: Secondary | ICD-10-CM

## 2022-01-05 MED ORDER — HYDROCHLOROTHIAZIDE 12.5 MG PO TABS: 12.5000 mg | ORAL_TABLET | Freq: Every day | ORAL | 0 refills | Status: DC

## 2022-01-05 NOTE — Telephone Encounter (Signed)
Caller Name: Britany Callicott Call back phone #: [phone]  MEDICATION(S): hydrochlorothiazide (HYDRODIURIL) 12.5 MG tablet   Days of Med Remaining: pt will be out before Tuesday 01/09/22  Has the patient contacted their pharmacy (YES/NO)?  Yes, she said they sent a request but havent heard back IF YES, when and what did the pharmacy advise?  IF NO, request that the patient contact the pharmacy for the refills in the future.             The pharmacy will send an electronic request (except for controlled medications).  Preferred Pharmacy: [facility] [address] in [place]  ~~~Please advise patient/caregiver to allow 2-3 business days to process RX refills.

## 2022-01-05 NOTE — Telephone Encounter (Signed)
Please notify patient that I never received a notification from her pharmacy for her refill request.  She is overdue for follow-up and will need to be scheduled ASAP.  Please notify me when she is scheduled.

## 2022-01-05 NOTE — Telephone Encounter (Signed)
Noted. Refill(s) sent to pharmacy.  

## 2022-01-10 ENCOUNTER — Ambulatory Visit (INDEPENDENT_AMBULATORY_CARE_PROVIDER_SITE_OTHER): Payer: BC Managed Care – PPO | Admitting: Primary Care

## 2022-01-10 ENCOUNTER — Encounter: Payer: Self-pay | Admitting: Primary Care

## 2022-01-10 VITALS — BP 118/72 | HR 62 | Temp 98.6°F | Ht 64.0 in | Wt 149.0 lb

## 2022-01-10 DIAGNOSIS — M2578 Osteophyte, vertebrae: Secondary | ICD-10-CM

## 2022-01-10 DIAGNOSIS — M545 Low back pain, unspecified: Secondary | ICD-10-CM | POA: Diagnosis not present

## 2022-01-10 DIAGNOSIS — J4540 Moderate persistent asthma, uncomplicated: Secondary | ICD-10-CM

## 2022-01-10 DIAGNOSIS — G8929 Other chronic pain: Secondary | ICD-10-CM

## 2022-01-10 DIAGNOSIS — I10 Essential (primary) hypertension: Secondary | ICD-10-CM

## 2022-01-10 MED ORDER — HYDROCHLOROTHIAZIDE 12.5 MG PO TABS: 12.5000 mg | ORAL_TABLET | Freq: Every day | ORAL | 3 refills | Status: DC

## 2022-01-10 NOTE — Assessment & Plan Note (Signed)
Controlled  Continue budesonide 180 mcg 1 puff once to twice daily. Continue albuterol inhaler PRN.

## 2022-01-10 NOTE — Patient Instructions (Signed)
Stop by the lab prior to leaving today. I will notify you of your results once received.   It was a pleasure to see you today!  

## 2022-01-10 NOTE — Assessment & Plan Note (Signed)
Controlled.  Continue HCTZ 12.5 mg daily. CMP due and pending.  Refills provided.

## 2022-01-10 NOTE — Assessment & Plan Note (Signed)
As evidenced on recent lumbar xray. Following with neurosurgery and PT.

## 2022-01-10 NOTE — Progress Notes (Signed)
Subjective:    Patient ID: Carolyn Watson, female    DOB: 1980-11-05, 41 y.o.   MRN: [id number]  HPI  Carolyn Watson is a very pleasant 41 y.o. female with a history of hypertension, moderate persistent asthma, chronic low back pain who presents today for follow-up of chronic conditions.  1) Hypertension: Currently managed on hydrochlorothiazide 12.5 mg daily. She dizziness, headaches. She is compliant to her medication daily.   BP Readings from Last 3 Encounters:  01/10/22 118/72  10/02/21 (!) 148/82  12/02/20 124/88     2) Chronic Back Pain: Acute on chronic flare recently. Evaluated by Cassandria Santee, NP on 10/02/2021 for a 3-day history of bilateral lower back pain without radiculopathy/numbness to lower extremities.  Symptoms improved with standing, worse with positional changes from sitting to standing. She was treated with Toradol 60 mg IM in the office that day and was advised to take meloxicam and methocarbamol as needed.    She underwent x-ray of the lumbar spine which revealed minimal multilevel endplate osteophytosis throughout lumbar spine with disc spaces preserved.  Given these results she was referred to neurosurgery for further evaluation.  Evaluated by neurosurgery on 11/16/2021, she endorsed also visiting [facility] after her appointment with Cassandria Santee, NP.  She endorsed continued lower back pain without radiculopathy, numbness, tingling.  She was referred to physical therapy for further treatment/management of her symptoms.  Today she endorses a return in her back pain as of four days ago for which is intermittent "like a catch". She resumed her Meloxicam and methocarbamol. She's completed 3 weeks of physical therapy, notices improvement during and just after treatment. She has contacted her neurosurgeon again since the return of her pain, she may undergo MRI.   3) Asthma: Currently managed on budesonide 180 mcg/act (1-2 inhalations daily) inhaler and albuterol HFA inhaler  PRN. She is using albuterol inhaler with heavy exercise at the gym, anywhere from 0-2 times weekly.  She is compliant to her Pulmicort daily.    Review of Systems  Respiratory:  Negative for chest tightness and shortness of breath.   Cardiovascular:  Negative for chest pain.  Musculoskeletal:  Positive for back pain.  Neurological:  Negative for dizziness, weakness, numbness and headaches.        Past Medical History:  Diagnosis Date   Asthma    Chickenpox    HELLP syndrome     Social History   Socioeconomic History   Marital status: Married    Spouse name: Not on file   Number of children: Not on file   Years of education: Not on file   Highest education level: Not on file  Occupational History   Not on file  Tobacco Use   Smoking status: Never   Smokeless tobacco: Never  Substance and Sexual Activity   Alcohol use: Yes   Drug use: Never   Sexual activity: Not on file  Other Topics Concern   Not on file  Social History Narrative   Married.   3 children.   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   Enjoys cooking, spending time outdoors.   Social Determinants of Health   Financial Resource Strain: Not on file  Food Insecurity: Not on file  Transportation Needs: Not on file  Physical Activity: Not on file  Stress: Not on file  Social Connections: Not on file  Intimate Partner Violence: Not on file    Past Surgical History:  Procedure Laterality Date   CESAREAN SECTION  2008, 2011  Family History  Problem Relation Age of Onset   Hyperlipidemia Mother    Hypertension Mother    Diabetes Father    Hyperlipidemia Father    Hypertension Father    Hypertension Sister    6 / Stillbirths Sister    Cancer Maternal Grandmother 68       breast   Diabetes Maternal Grandmother    Heart disease Maternal Grandfather    Hyperlipidemia Maternal Grandfather    Hypertension Maternal Grandfather    Intellectual disability Maternal Grandfather     No  Known Allergies  Current Outpatient Medications on File Prior to Visit  Medication Sig Dispense Refill   Albuterol Sulfate (PROAIR HFA IN) Inhale 1 puff into the lungs as directed.     budesonide (PULMICORT) 180 MCG/ACT inhaler Inhale 1 puff into the lungs daily.     methocarbamol (ROBAXIN) 500 MG tablet Take one po qid prn muscle spasm 28 tablet 0   meloxicam (MOBIC) 15 MG tablet meloxicam 15 mg tablet  TAKE 1 TABLET BY MOUTH EVERY DAY     No current facility-administered medications on file prior to visit.    BP 118/72   Pulse 62   Temp 98.6 F (37 C) (Oral)   Ht 5\' 4"  (1.626 m)   Wt 149 lb (67.6 kg)   SpO2 98%   BMI 25.58 kg/m  Objective:   Physical Exam Cardiovascular:     Rate and Rhythm: Normal rate and regular rhythm.  Pulmonary:     Effort: Pulmonary effort is normal.     Breath sounds: Normal breath sounds.  Musculoskeletal:     Cervical back: Neck supple.     Lumbar back: Spasms present. Normal range of motion. Negative right straight leg raise test and negative left straight leg raise test.     Comments: 5/5 strength to bilateral lower extremities   Skin:    General: Skin is warm and dry.          Assessment & Plan:

## 2022-01-10 NOTE — Assessment & Plan Note (Addendum)
Acute on chronic flare without resolve. No alarm signs on exam or HPI  Office notes reviewed from neurosurgery through [facility]. Continue PT. MRI likely to be pending soon.   Continue methocarbamol 500 mg PRN and meloxicam 15 mg PRN.

## 2022-01-11 LAB — COMPREHENSIVE METABOLIC PANEL
ALT: 9 U/L (ref 0–35)
AST: 14 U/L (ref 0–37)
Albumin: 4 g/dL (ref 3.5–5.2)
Alkaline Phosphatase: 35 U/L — ABNORMAL LOW (ref 39–117)
BUN: 22 mg/dL (ref 6–23)
CO2: 27 mEq/L (ref 19–32)
Calcium: 8.9 mg/dL (ref 8.4–10.5)
Chloride: 103 mEq/L (ref 96–112)
Creatinine, Ser: 0.87 mg/dL (ref 0.40–1.20)
GFR: 81.97 mL/min (ref >60.00)
Glucose, Bld: 91 mg/dL (ref 70–99)
Potassium: 4 mEq/L (ref 3.5–5.1)
Sodium: 138 mEq/L (ref 135–145)
Total Bilirubin: 0.3 mg/dL (ref 0.2–1.2)
Total Protein: 6.4 g/dL (ref 6.0–8.3)

## 2022-01-17 DIAGNOSIS — M545 Low back pain, unspecified: Secondary | ICD-10-CM | POA: Diagnosis not present

## 2022-09-21 DIAGNOSIS — Z1231 Encounter for screening mammogram for malignant neoplasm of breast: Secondary | ICD-10-CM | POA: Diagnosis not present

## 2022-12-20 DIAGNOSIS — N939 Abnormal uterine and vaginal bleeding, unspecified: Secondary | ICD-10-CM | POA: Diagnosis not present

## 2023-02-18 ENCOUNTER — Other Ambulatory Visit: Payer: Self-pay | Admitting: Primary Care

## 2023-02-18 DIAGNOSIS — Z6824 Body mass index (BMI) 24.0-24.9, adult: Secondary | ICD-10-CM | POA: Diagnosis not present

## 2023-02-18 DIAGNOSIS — I10 Essential (primary) hypertension: Secondary | ICD-10-CM

## 2023-02-18 DIAGNOSIS — Z01419 Encounter for gynecological examination (general) (routine) without abnormal findings: Secondary | ICD-10-CM | POA: Diagnosis not present

## 2023-02-19 ENCOUNTER — Encounter: Payer: Self-pay | Admitting: Primary Care

## 2023-02-19 ENCOUNTER — Ambulatory Visit: Payer: BC Managed Care – PPO | Admitting: Primary Care

## 2023-02-19 VITALS — BP 134/82 | HR 66 | Temp 97.6°F | Ht 64.0 in | Wt 149.0 lb

## 2023-02-19 DIAGNOSIS — J4520 Mild intermittent asthma, uncomplicated: Secondary | ICD-10-CM

## 2023-02-19 DIAGNOSIS — Z0000 Encounter for general adult medical examination without abnormal findings: Secondary | ICD-10-CM | POA: Insufficient documentation

## 2023-02-19 DIAGNOSIS — I10 Essential (primary) hypertension: Secondary | ICD-10-CM

## 2023-02-19 NOTE — Patient Instructions (Signed)
Stop by the lab prior to leaving today. I will notify you of your results once received.   It was a pleasure to see you today!  

## 2023-02-19 NOTE — Assessment & Plan Note (Signed)
Controlled.  Continue hydrochlorothiazide 12.5 mg daily.  CMP pending.

## 2023-02-19 NOTE — Progress Notes (Signed)
Subjective:    Patient ID: Carolyn Watson, female    DOB: Mar 05, 1981, 42 y.o.   MRN: [id number]  HPI  Carolyn Watson is a very pleasant 42 y.o. female who presents today for complete physical and follow up of chronic conditions.  Immunizations: -Tetanus: Completed in 2018  Diet: Fair diet.  Exercise: Regular exercise   Eye exam: Completes annually  Dental exam: Completes semi-annually    Pap Smear: April 2022 Mammogram: Completed in February 2024  BP Readings from Last 3 Encounters:  02/19/23 134/82  01/10/22 118/72  10/02/21 (!) 148/82       Review of Systems  Constitutional:  Negative for unexpected weight change.  HENT:  Negative for rhinorrhea.   Respiratory:  Negative for cough and shortness of breath.   Cardiovascular:  Negative for chest pain.  Gastrointestinal:  Negative for constipation and diarrhea.  Genitourinary:  Negative for difficulty urinating and menstrual problem.  Musculoskeletal:  Negative for arthralgias and myalgias.  Skin:  Negative for rash.  Allergic/Immunologic: Negative for environmental allergies.  Neurological:  Negative for dizziness and headaches.  Psychiatric/Behavioral:  The patient is not nervous/anxious.          Past Medical History:  Diagnosis Date   Asthma    Chickenpox    HELLP syndrome     Social History   Socioeconomic History   Marital status: Married    Spouse name: Not on file   Number of children: Not on file   Years of education: Not on file   Highest education level: Not on file  Occupational History   Not on file  Tobacco Use   Smoking status: Never   Smokeless tobacco: Never  Substance and Sexual Activity   Alcohol use: Yes   Drug use: Never   Sexual activity: Not on file  Other Topics Concern   Not on file  Social History Narrative   Married.   3 children.   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   Enjoys cooking, spending time outdoors.   Social Determinants of Health   Financial  Resource Strain: Not on file  Food Insecurity: Not on file  Transportation Needs: Not on file  Physical Activity: Not on file  Stress: Not on file  Social Connections: Not on file  Intimate Partner Violence: Not on file    Past Surgical History:  Procedure Laterality Date   CESAREAN SECTION  2008, 2011    Family History  Problem Relation Age of Onset   Hyperlipidemia Mother    Hypertension Mother    Diabetes Father    Hyperlipidemia Father    Hypertension Father    Hypertension Sister    Miscarriages / Stillbirths Sister    Cancer Maternal Grandmother 68       breast   Diabetes Maternal Grandmother    Heart disease Maternal Grandfather 60   Hyperlipidemia Maternal Grandfather    Hypertension Maternal Grandfather    Intellectual disability Maternal Grandfather    Heart attack Maternal Grandfather     No Known Allergies  Current Outpatient Medications on File Prior to Visit  Medication Sig Dispense Refill   Albuterol Sulfate (PROAIR HFA IN) Inhale 1 puff into the lungs as directed.     budesonide (PULMICORT) 180 MCG/ACT inhaler Inhale 1 puff into the lungs daily.     hydrochlorothiazide (HYDRODIURIL) 12.5 MG tablet Take 1 tablet (12.5 mg total) by mouth daily. For blood pressure. 90 tablet 3   Norethindrone-Ethinyl Estradiol-Fe Biphas (LO LOESTRIN FE)  1 MG-10 MCG / 10 MCG tablet Take 1 tablet by mouth daily.     No current facility-administered medications on file prior to visit.    BP 134/82   Pulse 66   Temp 97.6 F (36.4 C) (Temporal)   Ht 5\' 4"  (1.626 m)   Wt 149 lb (67.6 kg)   LMP 02/06/2023 (Exact Date)   SpO2 98%   BMI 25.58 kg/m  Objective:   Physical Exam HENT:     Right Ear: Tympanic membrane and ear canal normal.     Left Ear: Tympanic membrane and ear canal normal.     Nose: Nose normal.  Eyes:     Conjunctiva/sclera: Conjunctivae normal.     Pupils: Pupils are equal, round, and reactive to light.  Neck:     Thyroid: No thyromegaly.   Cardiovascular:     Rate and Rhythm: Normal rate and regular rhythm.     Heart sounds: No murmur heard. Pulmonary:     Effort: Pulmonary effort is normal.     Breath sounds: Normal breath sounds. No rales.  Abdominal:     General: Bowel sounds are normal.     Palpations: Abdomen is soft.     Tenderness: There is no abdominal tenderness.  Musculoskeletal:        General: Normal range of motion.     Cervical back: Neck supple.  Lymphadenopathy:     Cervical: No cervical adenopathy.  Skin:    General: Skin is warm and dry.     Findings: No rash.  Neurological:     Mental Status: She is alert and oriented to person, place, and time.     Cranial Nerves: No cranial nerve deficit.     Deep Tendon Reflexes: Reflexes are normal and symmetric.  Psychiatric:        Mood and Affect: Mood normal.           Assessment & Plan:  Preventative health care Assessment & Plan: Immunizations UTD. Pap smear UTD. Follows with GYN Mammogram UTD follows with GYN  Discussed the importance of a healthy diet and regular exercise in order for weight loss, and to reduce the risk of further co-morbidity.  Exam stable. Labs pending.  Follow up in 1 year for repeat physical.    Primary hypertension Assessment & Plan: Controlled.  Continue hydrochlorothiazide 12.5 mg daily.  CMP pending.  Orders: -     Comprehensive metabolic panel -     Lipid panel -     CBC  Mild intermittent asthma without complication Assessment & Plan: Controlled.  Continue budesonide 180 mcg, 1 puff daily. Continue albuterol inhaler as needed.          Doreene Nest, NP

## 2023-02-19 NOTE — Assessment & Plan Note (Signed)
Controlled.  Continue budesonide 180 mcg, 1 puff daily. Continue albuterol inhaler as needed.

## 2023-02-19 NOTE — Assessment & Plan Note (Signed)
Immunizations UTD. Pap smear UTD. Follows with GYN Mammogram UTD follows with GYN  Discussed the importance of a healthy diet and regular exercise in order for weight loss, and to reduce the risk of further co-morbidity.  Exam stable. Labs pending.  Follow up in 1 year for repeat physical.

## 2023-02-20 LAB — CBC
HCT: 40.2 % (ref 36.0–46.0)
Hemoglobin: 13.2 g/dL (ref 12.0–15.0)
MCHC: 32.8 g/dL (ref 30.0–36.0)
MCV: 91 fl (ref 78.0–100.0)
Platelets: 363 10*3/uL (ref 150.0–400.0)
RBC: 4.41 Mil/uL (ref 3.87–5.11)
RDW: 13.2 % (ref 11.5–15.5)
WBC: 7.6 10*3/uL (ref 4.0–10.5)

## 2023-02-20 LAB — COMPREHENSIVE METABOLIC PANEL
ALT: 9 U/L (ref 0–35)
AST: 14 U/L (ref 0–37)
Albumin: 4.1 g/dL (ref 3.5–5.2)
Alkaline Phosphatase: 40 U/L (ref 39–117)
BUN: 22 mg/dL (ref 6–23)
CO2: 27 mEq/L (ref 19–32)
Calcium: 9.5 mg/dL (ref 8.4–10.5)
Chloride: 101 mEq/L (ref 96–112)
Creatinine, Ser: 0.9 mg/dL (ref 0.40–1.20)
GFR: 78.09 mL/min (ref >60.00)
Glucose, Bld: 103 mg/dL — ABNORMAL HIGH (ref 70–99)
Potassium: 4 mEq/L (ref 3.5–5.1)
Sodium: 138 mEq/L (ref 135–145)
Total Bilirubin: 0.3 mg/dL (ref 0.2–1.2)
Total Protein: 6.4 g/dL (ref 6.0–8.3)

## 2023-02-20 LAB — LDL CHOLESTEROL, DIRECT: Direct LDL: 121 mg/dL

## 2023-02-20 LAB — LIPID PANEL
Cholesterol: 216 mg/dL — ABNORMAL HIGH (ref 0–200)
HDL: 47.3 mg/dL (ref >39.00)
NonHDL: 168.33
Total CHOL/HDL Ratio: 5
Triglycerides: 260 mg/dL — ABNORMAL HIGH (ref 0.0–149.0)
VLDL: 52 mg/dL — ABNORMAL HIGH (ref 0.0–40.0)

## 2023-05-13 DIAGNOSIS — D2261 Melanocytic nevi of right upper limb, including shoulder: Secondary | ICD-10-CM | POA: Diagnosis not present

## 2023-05-13 DIAGNOSIS — D2262 Melanocytic nevi of left upper limb, including shoulder: Secondary | ICD-10-CM | POA: Diagnosis not present

## 2023-05-13 DIAGNOSIS — D225 Melanocytic nevi of trunk: Secondary | ICD-10-CM | POA: Diagnosis not present

## 2023-05-13 DIAGNOSIS — D2272 Melanocytic nevi of left lower limb, including hip: Secondary | ICD-10-CM | POA: Diagnosis not present

## 2023-11-14 DIAGNOSIS — Z1231 Encounter for screening mammogram for malignant neoplasm of breast: Secondary | ICD-10-CM | POA: Diagnosis not present

## 2023-11-14 LAB — HM MAMMOGRAPHY

## 2024-02-05 IMAGING — DX DG LUMBAR SPINE COMPLETE 4+V
5 series · 5 of 5 positions shown · non-contrast
Comparison: None.

CLINICAL DATA: Low back pain for 3 days, no reported injury

EXAM:
LUMBAR SPINE - COMPLETE 4+ VIEW

[lumbar spine ap]
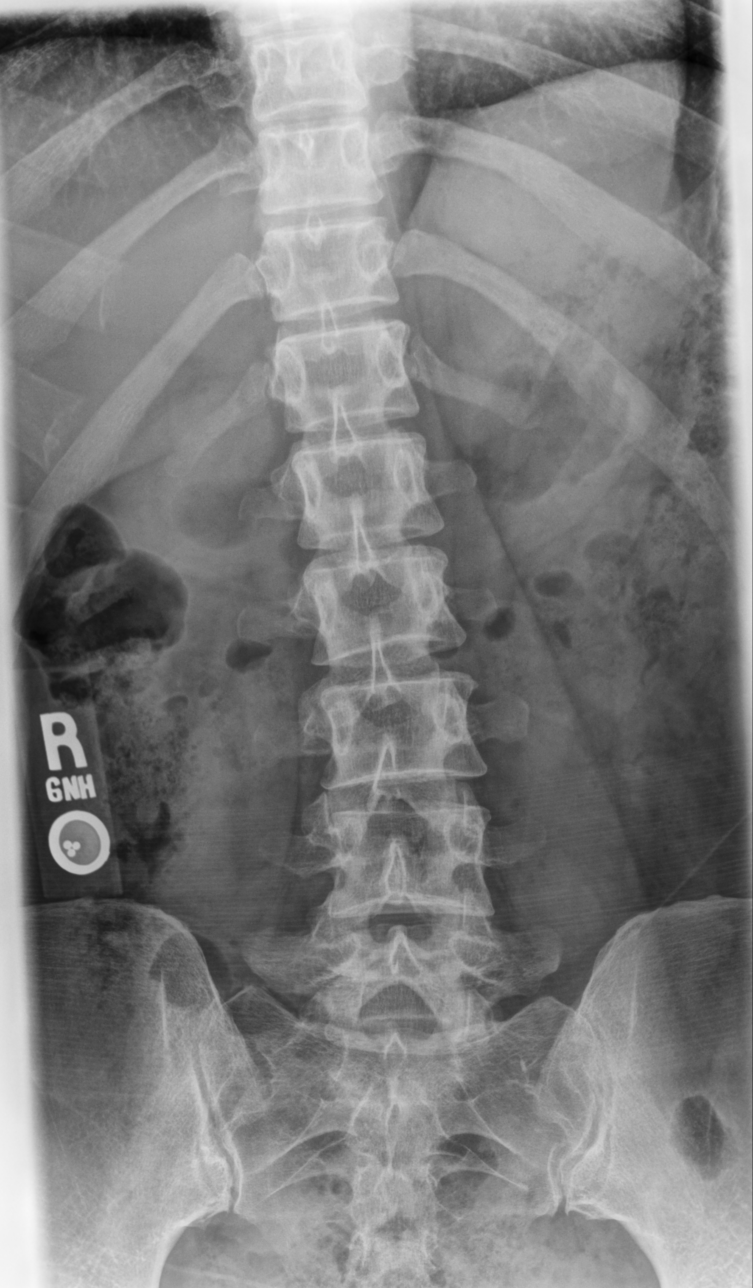

[lumbar spine mlo (1 of 2)]
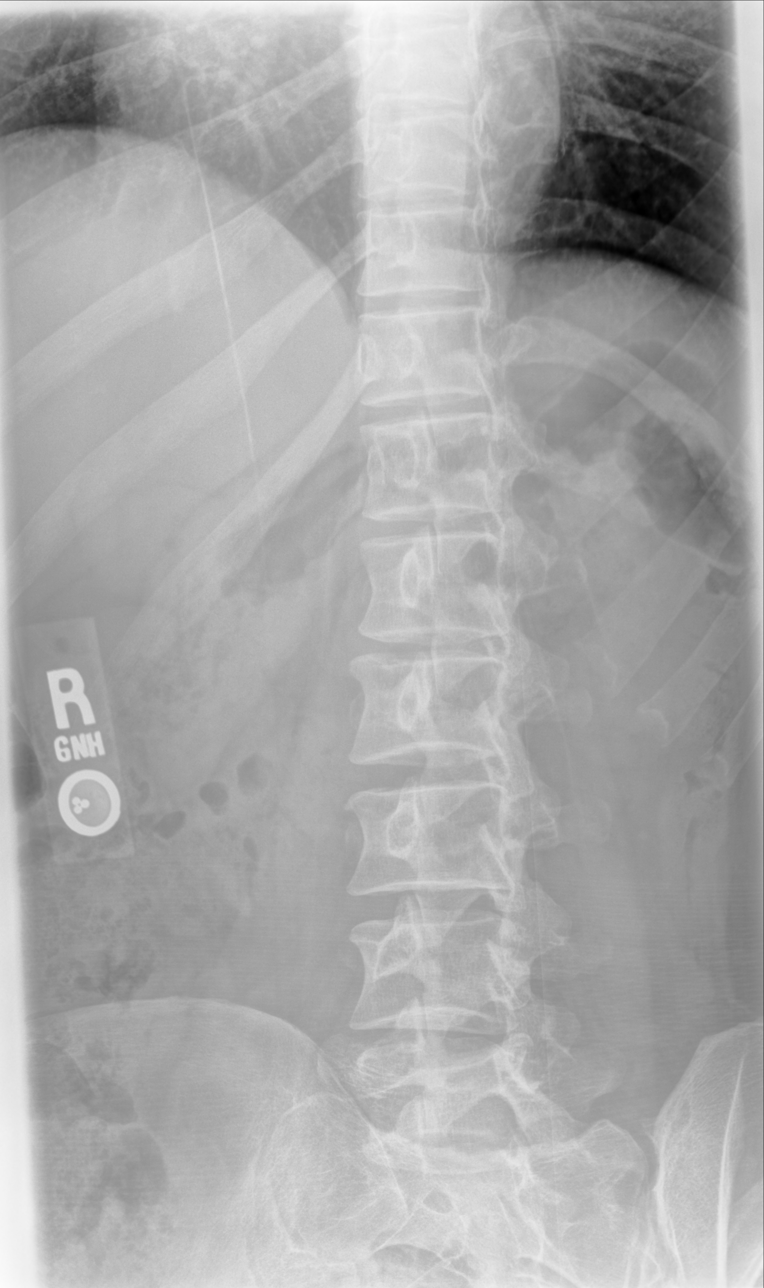

[lumbar spine mlo (2 of 2)]
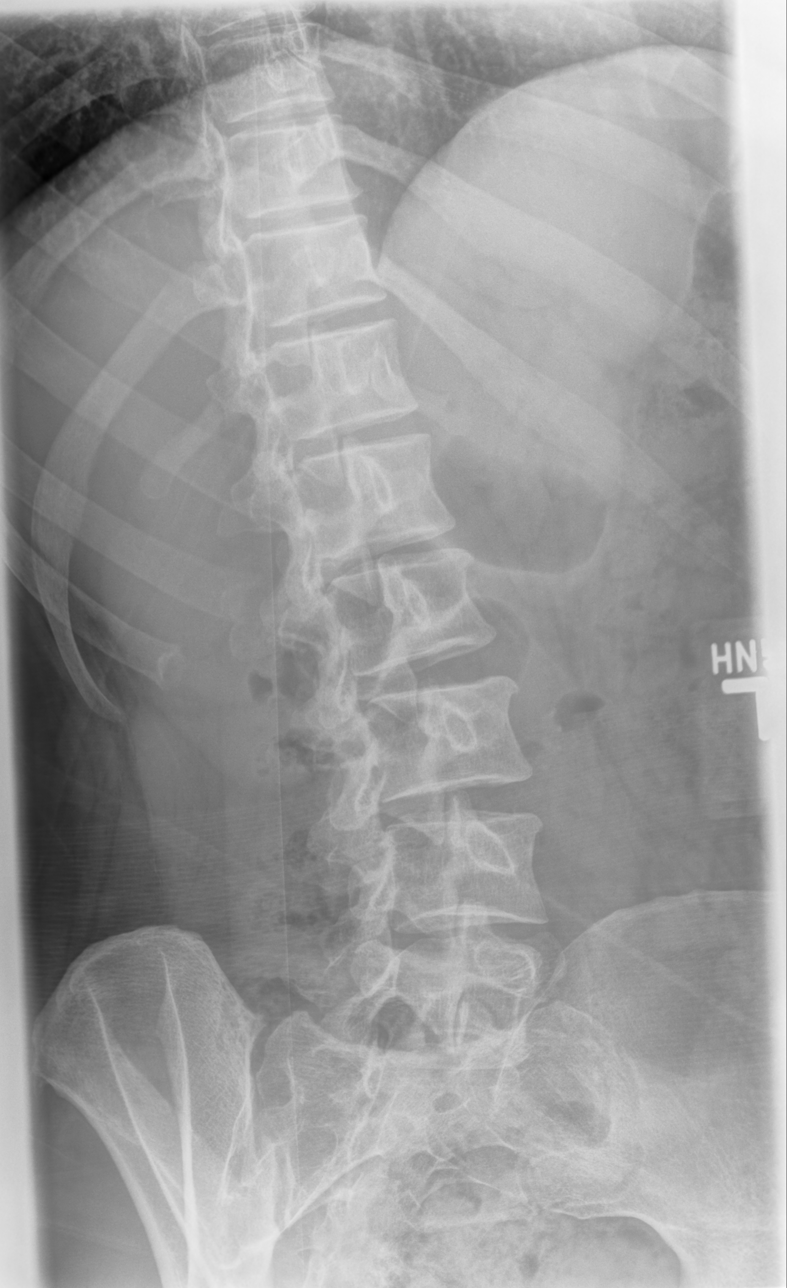

[lumbar spine lat (1 of 2)]
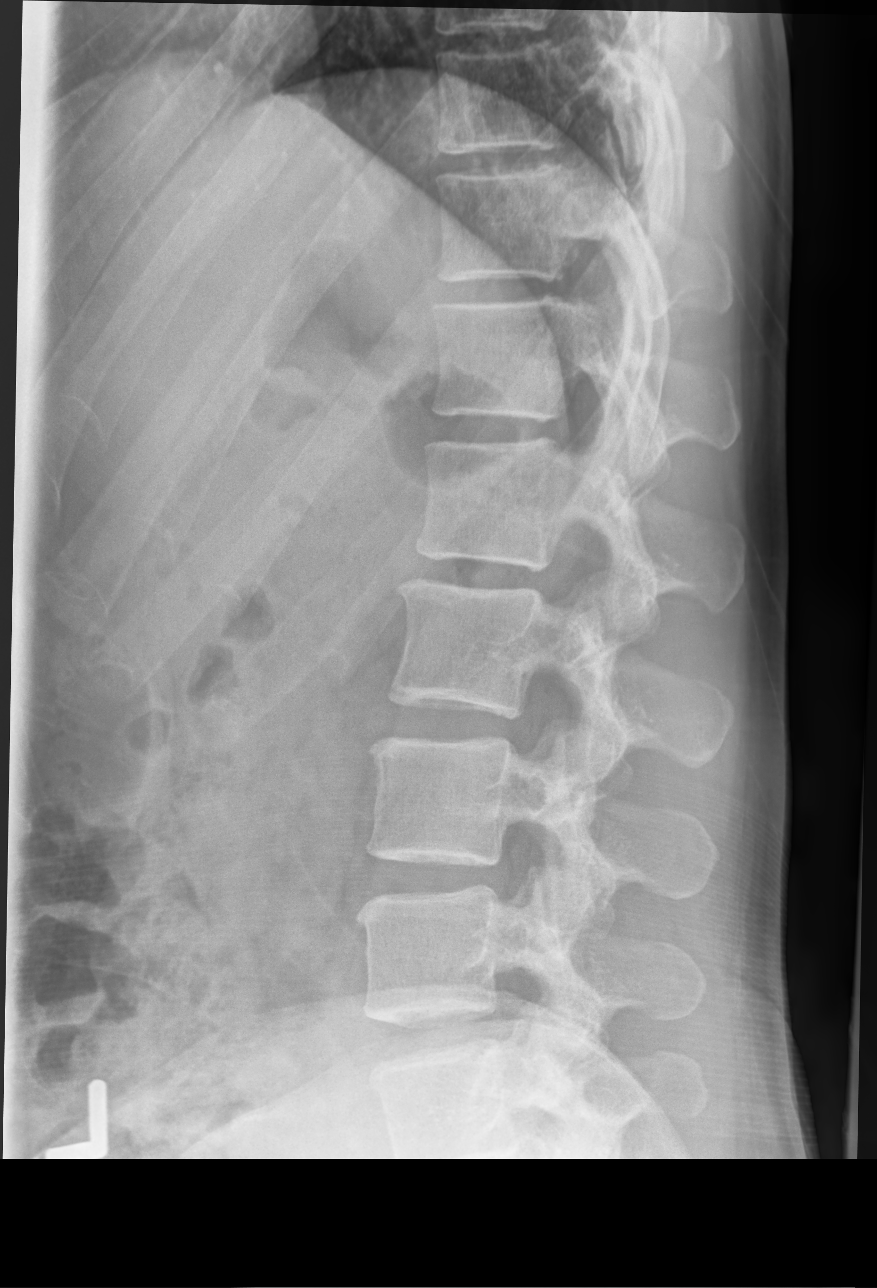

[lumbar spine lat (2 of 2)]
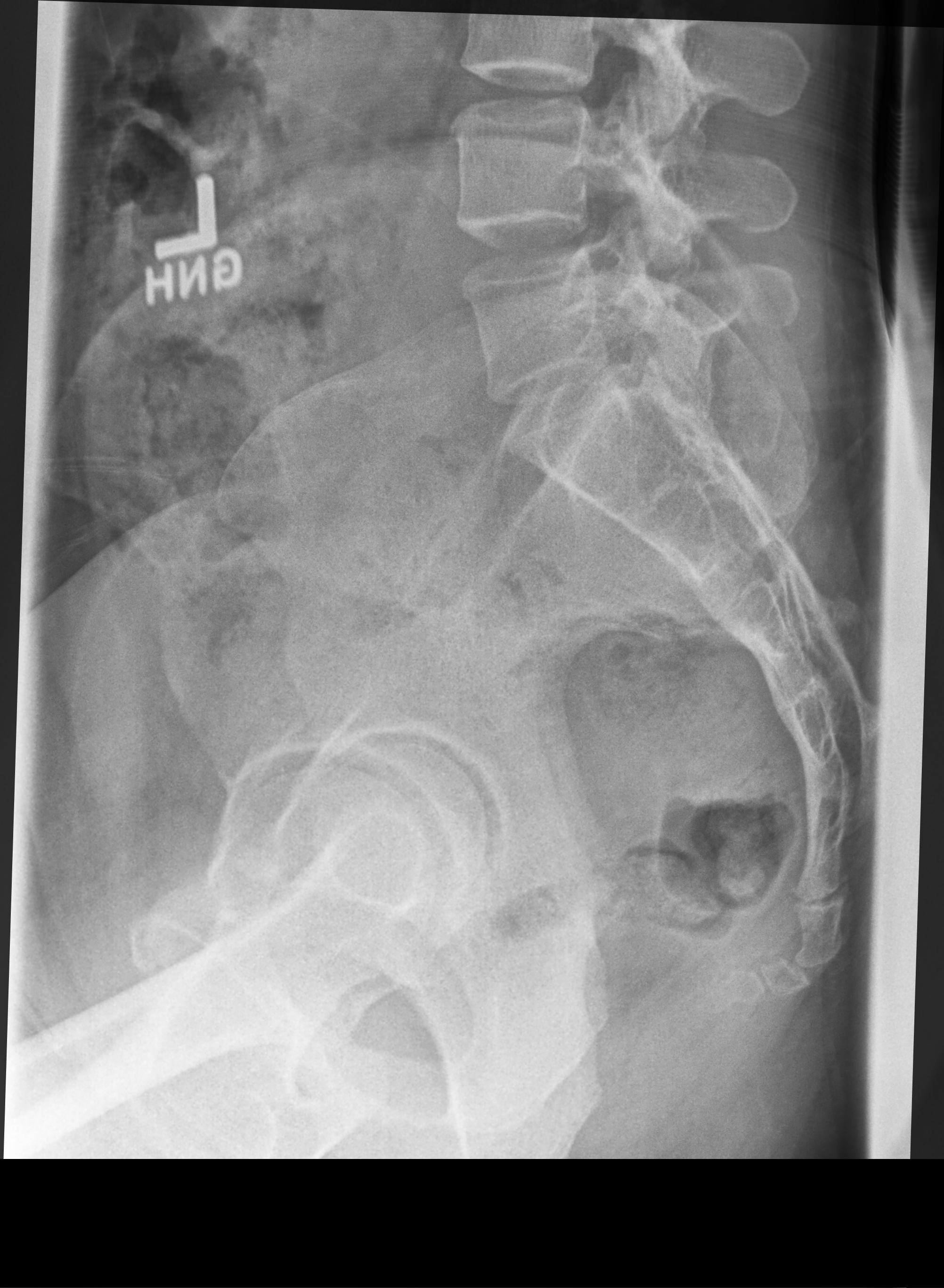

[5 of 5 positions shown; findings below may reference images not displayed]

FINDINGS: No fracture or dislocation of the lumbar spine. Very gentle
levoscoliosis of the lumbar spine, most likely positional. There is
minimal multilevel endplate osteophytosis throughout the lumbar
spine with generally preserved disc spaces. Facets are intact.
Nonobstructive pattern of overlying bowel gas.
IMPRESSION: 1.  No fracture or dislocation of the lumbar spine.

2. Minimal multilevel endplate osteophytosis throughout the lumbar
spine with generally preserved disc spaces. Lumbar disc and neural
foraminal pathology may be further evaluated by MRI if indicated by
neurologically localizing signs and symptoms.

## 2024-02-19 DIAGNOSIS — Z1151 Encounter for screening for human papillomavirus (HPV): Secondary | ICD-10-CM | POA: Diagnosis not present

## 2024-02-19 DIAGNOSIS — Z6823 Body mass index (BMI) 23.0-23.9, adult: Secondary | ICD-10-CM | POA: Diagnosis not present

## 2024-02-19 DIAGNOSIS — Z124 Encounter for screening for malignant neoplasm of cervix: Secondary | ICD-10-CM | POA: Diagnosis not present

## 2024-02-19 DIAGNOSIS — Z01419 Encounter for gynecological examination (general) (routine) without abnormal findings: Secondary | ICD-10-CM | POA: Diagnosis not present

## 2024-02-25 ENCOUNTER — Other Ambulatory Visit: Payer: Self-pay | Admitting: Primary Care

## 2024-02-25 DIAGNOSIS — I10 Essential (primary) hypertension: Secondary | ICD-10-CM

## 2024-02-25 NOTE — Telephone Encounter (Signed)
 Patient is due for CPE/follow up, this will be required prior to any further refills.  Please schedule, thank you!

## 2024-02-26 NOTE — Telephone Encounter (Signed)
 Lvmtcb, sent mychart message

## 2024-03-17 ENCOUNTER — Ambulatory Visit (INDEPENDENT_AMBULATORY_CARE_PROVIDER_SITE_OTHER): Admitting: Primary Care

## 2024-03-17 ENCOUNTER — Encounter: Payer: Self-pay | Admitting: Primary Care

## 2024-03-17 VITALS — BP 108/62 | HR 69 | Temp 97.2°F | Ht 64.0 in | Wt 148.0 lb

## 2024-03-17 DIAGNOSIS — J4520 Mild intermittent asthma, uncomplicated: Secondary | ICD-10-CM | POA: Diagnosis not present

## 2024-03-17 DIAGNOSIS — Z0000 Encounter for general adult medical examination without abnormal findings: Secondary | ICD-10-CM | POA: Diagnosis not present

## 2024-03-17 DIAGNOSIS — G8929 Other chronic pain: Secondary | ICD-10-CM

## 2024-03-17 DIAGNOSIS — I10 Essential (primary) hypertension: Secondary | ICD-10-CM | POA: Diagnosis not present

## 2024-03-17 DIAGNOSIS — Z1211 Encounter for screening for malignant neoplasm of colon: Secondary | ICD-10-CM

## 2024-03-17 DIAGNOSIS — M545 Low back pain, unspecified: Secondary | ICD-10-CM

## 2024-03-17 NOTE — Progress Notes (Signed)
 Subjective:    Patient ID: Carolyn Watson, female    DOB: 02/26/1981, 43 y.o.   MRN: [id number]  HPI  Carolyn Watson is a very pleasant 43 y.o. female who presents today for complete physical and follow up of chronic conditions.  Immunizations: -Tetanus: Completed in 2018  Diet: Fair diet.  Exercise: No regular exercise.  Eye exam: Completes annually  Dental exam: Completes semi-annually    Pap Smear: Completed in July 2025 Mammogram: Completed in April 2025  Colonoscopy: Due at age 72.  BP Readings from Last 3 Encounters:  03/17/24 108/62  02/19/23 134/82  01/10/22 118/72     Review of Systems  Constitutional:  Negative for unexpected weight change.  HENT:  Negative for rhinorrhea.   Respiratory:  Negative for cough and shortness of breath.   Cardiovascular:  Negative for chest pain.  Gastrointestinal:  Negative for constipation and diarrhea.  Genitourinary:  Negative for difficulty urinating.  Musculoskeletal:  Negative for arthralgias and myalgias.  Skin:  Negative for rash.  Allergic/Immunologic: Negative for environmental allergies.  Neurological:  Negative for dizziness, numbness and headaches.  Psychiatric/Behavioral:  The patient is not nervous/anxious.          Past Medical History:  Diagnosis Date   Acute bilateral low back pain without sciatica 10/02/2021   Asthma    Chickenpox    HELLP syndrome     Social History   Socioeconomic History   Marital status: Married    Spouse name: Not on file   Number of children: Not on file   Years of education: Not on file   Highest education level: Some college, no degree  Occupational History   Not on file  Tobacco Use   Smoking status: Never   Smokeless tobacco: Never  Substance and Sexual Activity   Alcohol use: Yes   Drug use: Never   Sexual activity: Not on file  Other Topics Concern   Not on file  Social History Narrative   Married.   3 children.   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   Enjoys cooking, spending time outdoors.   Social Drivers of Corporate investment banker Strain: Low Risk  (03/16/2024)   Overall Financial Resource Strain (CARDIA)    Difficulty of Paying Living Expenses: Not hard at all  Food Insecurity: No Food Insecurity (03/16/2024)   Hunger Vital Sign    Worried About Running Out of Food in the Last Year: Never true    Ran Out of Food in the Last Year: Never true  Transportation Needs: No Transportation Needs (03/16/2024)   PRAPARE - Administrator, Civil Service (Medical): No    Lack of Transportation (Non-Medical): No  Physical Activity: Sufficiently Active (03/16/2024)   Exercise Vital Sign    Days of Exercise per Week: 5 days    Minutes of Exercise per Session: 50 min  Stress: No Stress Concern Present (03/16/2024)   [employer] of Occupational Health - Occupational Stress Questionnaire    Feeling of Stress: Not at all  Social Connections: Moderately Integrated (03/16/2024)   Social Connection and Isolation Panel    Frequency of Communication with Friends and Family: More than three times a week    Frequency of Social Gatherings with Friends and Family: Once a week    Attends Religious Services: More than 4 times per year    Active Member of [employer] or Organizations: No    Attends Banker Meetings: Not on file  Marital Status: Married  Catering manager Violence: Not on file    Past Surgical History:  Procedure Laterality Date   CESAREAN SECTION  2008, 2011    Family History  Problem Relation Age of Onset   Hyperlipidemia Mother    Hypertension Mother    Diabetes Father    Hyperlipidemia Father    Hypertension Father    Hypertension Sister    Miscarriages / Stillbirths Sister    Cancer Maternal Grandmother 68       breast   Diabetes Maternal Grandmother    Heart disease Maternal Grandfather 60   Hyperlipidemia Maternal Grandfather    Hypertension Maternal Grandfather    Intellectual disability  Maternal Grandfather    Heart attack Maternal Grandfather     No Known Allergies  Current Outpatient Medications on File Prior to Visit  Medication Sig Dispense Refill   Albuterol Sulfate (PROAIR HFA IN) Inhale 1 puff into the lungs as directed.     budesonide (PULMICORT) 180 MCG/ACT inhaler Inhale 1 puff into the lungs daily.     hydrochlorothiazide  (HYDRODIURIL ) 12.5 MG tablet TAKE 1 TABLET (12.5 MG TOTAL) BY MOUTH DAILY FOR BLOOD PRESSURE 30 tablet 0   No current facility-administered medications on file prior to visit.    BP 108/62   Pulse 69   Temp (!) 97.2 F (36.2 C) (Temporal)   Ht 5' 4 (1.626 m)   Wt 148 lb (67.1 kg)   LMP 06/14/2023 (Within Months)   SpO2 98%   BMI 25.40 kg/m  Objective:   Physical Exam HENT:     Right Ear: Tympanic membrane and ear canal normal.     Left Ear: Tympanic membrane and ear canal normal.  Eyes:     Pupils: Pupils are equal, round, and reactive to light.  Cardiovascular:     Rate and Rhythm: Normal rate and regular rhythm.  Pulmonary:     Effort: Pulmonary effort is normal.     Breath sounds: Normal breath sounds.  Abdominal:     General: Bowel sounds are normal.     Palpations: Abdomen is soft.     Tenderness: There is no abdominal tenderness.  Musculoskeletal:        General: Normal range of motion.     Cervical back: Neck supple.  Skin:    General: Skin is warm and dry.  Neurological:     Mental Status: She is alert and oriented to person, place, and time.     Cranial Nerves: No cranial nerve deficit.     Deep Tendon Reflexes:     Reflex Scores:      Patellar reflexes are 2+ on the right side and 2+ on the left side. Psychiatric:        Mood and Affect: Mood normal.           Assessment & Plan:  Preventative health care Assessment & Plan: Immunizations UTD. Pap smear UTD. Follows with GYN Mammogram UTD Colonoscopy due at age 38, referral placed to GI.  Discussed the importance of a healthy diet and regular  exercise in order for weight loss, and to reduce the risk of further co-morbidity.  Exam stable. Labs pending.  Follow up in 1 year for repeat physical.    Mild intermittent asthma without complication Assessment & Plan: Controlled.  Continue Pulmicort 180 mcg, 1 puff daily. Continue albuterol inhaler PRN   Primary hypertension Assessment & Plan: Controlled. Continue metoprolol for 12.5 mg daily. CMP pending.  Orders: -  Lipid panel -     Comprehensive metabolic panel with GFR  Chronic midline low back pain without sciatica Assessment & Plan: Controlled.  Continue to monitor.   Screening for colon cancer -     Ambulatory referral to Gastroenterology        Comer MARLA Gaskins, NP

## 2024-03-17 NOTE — Assessment & Plan Note (Signed)
 Controlled.  Continue to monitor.

## 2024-03-17 NOTE — Assessment & Plan Note (Signed)
 Controlled.  Continue Pulmicort 180 mcg, 1 puff daily. Continue albuterol inhaler PRN

## 2024-03-17 NOTE — Patient Instructions (Signed)
 Stop by the lab prior to leaving today. I will notify you of your results once received.   You will receive a phone call regarding the colonoscopy.  It was a pleasure to see you today!

## 2024-03-17 NOTE — Assessment & Plan Note (Signed)
 Immunizations UTD. Pap smear UTD. Follows with GYN Mammogram UTD Colonoscopy due at age 43, referral placed to GI.  Discussed the importance of a healthy diet and regular exercise in order for weight loss, and to reduce the risk of further co-morbidity.  Exam stable. Labs pending.  Follow up in 1 year for repeat physical.

## 2024-03-17 NOTE — Assessment & Plan Note (Signed)
 Controlled. Continue metoprolol for 12.5 mg daily. CMP pending.

## 2024-03-18 ENCOUNTER — Ambulatory Visit: Payer: Self-pay | Admitting: Primary Care

## 2024-03-18 DIAGNOSIS — E785 Hyperlipidemia, unspecified: Secondary | ICD-10-CM

## 2024-03-18 LAB — LIPID PANEL
Cholesterol: 249 mg/dL — ABNORMAL HIGH (ref 0–200)
HDL: 57.8 mg/dL (ref >39.00)
LDL Cholesterol: 147 mg/dL — ABNORMAL HIGH (ref 0–99)
NonHDL: 191.24
Total CHOL/HDL Ratio: 4
Triglycerides: 223 mg/dL — ABNORMAL HIGH (ref 0.0–149.0)
VLDL: 44.6 mg/dL — ABNORMAL HIGH (ref 0.0–40.0)

## 2024-03-18 LAB — COMPREHENSIVE METABOLIC PANEL WITH GFR
ALT: 12 U/L (ref 0–35)
AST: 15 U/L (ref 0–37)
Albumin: 4.3 g/dL (ref 3.5–5.2)
Alkaline Phosphatase: 45 U/L (ref 39–117)
BUN: 20 mg/dL (ref 6–23)
CO2: 29 meq/L (ref 19–32)
Calcium: 9.4 mg/dL (ref 8.4–10.5)
Chloride: 99 meq/L (ref 96–112)
Creatinine, Ser: 0.97 mg/dL (ref 0.40–1.20)
GFR: 70.84 mL/min (ref >60.00)
Glucose, Bld: 96 mg/dL (ref 70–99)
Potassium: 4.3 meq/L (ref 3.5–5.1)
Sodium: 137 meq/L (ref 135–145)
Total Bilirubin: 0.3 mg/dL (ref 0.2–1.2)
Total Protein: 6.9 g/dL (ref 6.0–8.3)

## 2024-03-25 ENCOUNTER — Ambulatory Visit
Admission: RE | Admit: 2024-03-25 | Discharge: 2024-03-25 | Disposition: A | Discharge status: Home / Self Care | Payer: Self-pay | Source: Ambulatory Visit | Attending: Primary Care | Admitting: Primary Care

## 2024-03-25 ENCOUNTER — Ambulatory Visit: Payer: Self-pay | Admitting: Primary Care

## 2024-03-25 DIAGNOSIS — E785 Hyperlipidemia, unspecified: Secondary | ICD-10-CM | POA: Insufficient documentation

## 2024-03-26 ENCOUNTER — Other Ambulatory Visit: Payer: Self-pay | Admitting: Primary Care

## 2024-03-26 DIAGNOSIS — I10 Essential (primary) hypertension: Secondary | ICD-10-CM

## 2024-05-04 ENCOUNTER — Encounter: Payer: Self-pay | Admitting: Pediatrics

## 2024-05-14 DIAGNOSIS — D2262 Melanocytic nevi of left upper limb, including shoulder: Secondary | ICD-10-CM | POA: Diagnosis not present

## 2024-05-14 DIAGNOSIS — D2272 Melanocytic nevi of left lower limb, including hip: Secondary | ICD-10-CM | POA: Diagnosis not present

## 2024-05-14 DIAGNOSIS — D225 Melanocytic nevi of trunk: Secondary | ICD-10-CM | POA: Diagnosis not present

## 2024-05-14 DIAGNOSIS — D2261 Melanocytic nevi of right upper limb, including shoulder: Secondary | ICD-10-CM | POA: Diagnosis not present

## 2024-05-29 ENCOUNTER — Ambulatory Visit (AMBULATORY_SURGERY_CENTER)

## 2024-05-29 VITALS — Ht 64.0 in | Wt 145.4 lb

## 2024-05-29 DIAGNOSIS — Z1211 Encounter for screening for malignant neoplasm of colon: Secondary | ICD-10-CM

## 2024-05-29 MED ORDER — NA SULFATE-K SULFATE-MG SULF 17.5-3.13-1.6 GM/177ML PO SOLN: 1.0000 | Freq: Once | ORAL | 0 refills | Status: AC

## 2024-05-29 NOTE — Progress Notes (Signed)

## 2024-06-15 ENCOUNTER — Encounter: Payer: Self-pay | Admitting: Pediatrics

## 2024-06-17 NOTE — Progress Notes (Unsigned)
 [facility] History and Physical   Primary Care Physician:  Gretta Comer POUR, NP   Reason for Procedure:  Colorectal cancer screening  Plan:    Colonoscopy     HPI: Carolyn Watson is a 43 y.o. female undergoing colonoscopy for colorectal cancer screening.  This is the patient's first colonoscopy.  No family history of colorectal cancer or polyps.  Patient denies current symptoms of rectal bleeding or change in bowel habits.   Past Medical History:  Diagnosis Date   Acute bilateral low back pain without sciatica 10/02/2021   Asthma    Chickenpox    HELLP syndrome     Past Surgical History:  Procedure Laterality Date   CESAREAN SECTION  2008, 2011    Prior to Admission medications   Medication Sig Start Date End Date Taking? Authorizing Provider  Albuterol Sulfate (PROAIR HFA IN) Inhale 1 puff into the lungs as directed.    [provider]  budesonide (PULMICORT) 180 MCG/ACT inhaler Inhale 1 puff into the lungs daily.    [provider]  hydrochlorothiazide  (HYDRODIURIL ) 12.5 MG tablet TAKE 1 TABLET (12.5 MG TOTAL) BY MOUTH DAILY FOR BLOOD PRESSURE 03/27/24   Clark, Katherine K, NP    Current Outpatient Medications  Medication Sig Dispense Refill   Albuterol Sulfate (PROAIR HFA IN) Inhale 1 puff into the lungs as directed.     budesonide (PULMICORT) 180 MCG/ACT inhaler Inhale 1 puff into the lungs daily.     hydrochlorothiazide  (HYDRODIURIL ) 12.5 MG tablet TAKE 1 TABLET (12.5 MG TOTAL) BY MOUTH DAILY FOR BLOOD PRESSURE 90 tablet 3   No current facility-administered medications for this visit.    Allergies as of 06/19/2024   (No Known Allergies)    Family History  Problem Relation Age of Onset   Hyperlipidemia Mother    Hypertension Mother    Diabetes Father    Hyperlipidemia Father    Hypertension Father    Hypertension Sister    Miscarriages / Stillbirths Sister    Cancer Maternal Grandmother 68       breast   Diabetes  Maternal Grandmother    Heart disease Maternal Grandfather 52   Hyperlipidemia Maternal Grandfather    Hypertension Maternal Grandfather    Intellectual disability Maternal Grandfather    Heart attack Maternal Grandfather    Colon cancer Neg Hx    Esophageal cancer Neg Hx    Rectal cancer Neg Hx    Stomach cancer Neg Hx     Social History   Socioeconomic History   Marital status: Married    Spouse name: Not on file   Number of children: Not on file   Years of education: Not on file   Highest education level: Some college, no degree  Occupational History   Not on file  Tobacco Use   Smoking status: Never   Smokeless tobacco: Never  Vaping Use   Vaping status: Never Used  Substance and Sexual Activity   Alcohol use: Yes    Comment: socially   Drug use: Never   Sexual activity: Not on file  Other Topics Concern   Not on file  Social History Narrative   Married.   3 children.   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   Enjoys cooking, spending time outdoors.   Social Drivers of Corporate Investment Banker Strain: Low Risk  (03/16/2024)   Overall Financial Resource Strain (CARDIA)    Difficulty of Paying Living Expenses: Not hard at all  Food Insecurity:  No Food Insecurity (03/16/2024)   Hunger Vital Sign    Worried About Running Out of Food in the Last Year: Never true    Ran Out of Food in the Last Year: Never true  Transportation Needs: No Transportation Needs (03/16/2024)   PRAPARE - Administrator, Civil Service (Medical): No    Lack of Transportation (Non-Medical): No  Physical Activity: Sufficiently Active (03/16/2024)   Exercise Vital Sign    Days of Exercise per Week: 5 days    Minutes of Exercise per Session: 50 min  Stress: No Stress Concern Present (03/16/2024)   [employer] of Occupational Health - Occupational Stress Questionnaire    Feeling of Stress: Not at all  Social Connections: Moderately Integrated (03/16/2024)   Social Connection and  Isolation Panel    Frequency of Communication with Friends and Family: More than three times a week    Frequency of Social Gatherings with Friends and Family: Once a week    Attends Religious Services: More than 4 times per year    Active Member of [employer] or Organizations: No    Attends Engineer, Structural: Not on file    Marital Status: Married  Catering Manager Violence: Not on file    Review of Systems:  All other review of systems negative except as mentioned in the HPI.  Physical Exam: Vital signs There were no vitals taken for this visit.  General:   Alert,  Well-developed, well-nourished, pleasant and cooperative in NAD Airway:  Mallampati  Lungs:  Clear throughout to auscultation.   Heart:  Regular rate and rhythm; no murmurs, clicks, rubs,  or gallops. Abdomen:  Soft, nontender and nondistended. Normal bowel sounds.   Neuro/Psych:  Normal mood and affect. A and O x 3  Inocente Hausen, MD [facility]

## 2024-06-19 ENCOUNTER — Encounter: Payer: Self-pay | Admitting: Pediatrics

## 2024-06-19 ENCOUNTER — Ambulatory Visit (AMBULATORY_SURGERY_CENTER): Admitting: Pediatrics

## 2024-06-19 VITALS — BP 120/85 | HR 62 | Temp 98.6°F | Resp 16 | Ht 64.0 in | Wt 145.0 lb

## 2024-06-19 DIAGNOSIS — K648 Other hemorrhoids: Secondary | ICD-10-CM | POA: Diagnosis not present

## 2024-06-19 DIAGNOSIS — K635 Polyp of colon: Secondary | ICD-10-CM | POA: Diagnosis not present

## 2024-06-19 DIAGNOSIS — Z83719 Family history of colon polyps, unspecified: Secondary | ICD-10-CM

## 2024-06-19 DIAGNOSIS — Z1211 Encounter for screening for malignant neoplasm of colon: Secondary | ICD-10-CM

## 2024-06-19 DIAGNOSIS — D122 Benign neoplasm of ascending colon: Secondary | ICD-10-CM

## 2024-06-19 MED ORDER — SODIUM CHLORIDE 0.9 % IV SOLN: 500.0000 mL | Freq: Once | INTRAVENOUS | Status: DC

## 2024-06-19 NOTE — Progress Notes (Signed)
 Pt's states no medical or surgical changes since previsit or office visit.

## 2024-06-19 NOTE — Patient Instructions (Signed)
 Resume previous diet Continue present medications Await pathology results Handouts/information given for polyps and hemorrhoids  YOU HAD AN ENDOSCOPIC PROCEDURE TODAY AT THE [facility]:   Refer to the procedure report that was given to you for any specific questions about what was found during the examination.  If the procedure report does not answer your questions, please call your gastroenterologist to clarify.  If you requested that your care partner not be given the details of your procedure findings, then the procedure report has been included in a sealed envelope for you to review at your convenience later.  YOU SHOULD EXPECT: Some feelings of bloating in the abdomen. Passage of more gas than usual.  Walking can help get rid of the air that was put into your GI tract during the procedure and reduce the bloating. If you had a lower endoscopy (such as a colonoscopy or flexible sigmoidoscopy) you may notice spotting of blood in your stool or on the toilet paper. If you underwent a bowel prep for your procedure, you may not have a normal bowel movement for a few days.  Please Note:  You might notice some irritation and congestion in your nose or some drainage.  This is from the oxygen used during your procedure.  There is no need for concern and it should clear up in a day or so.  SYMPTOMS TO REPORT IMMEDIATELY:  Following lower endoscopy (colonoscopy):  Excessive amounts of blood in the stool  Significant tenderness or worsening of abdominal pains  Swelling of the abdomen that is new, acute  Fever of 100F or higher  For urgent or emergent issues, a gastroenterologist can be reached at any hour by calling [phone]. Do not use MyChart messaging for urgent concerns.   DIET:  We do recommend a small meal at first, but then you may proceed to your regular diet.  Drink plenty of fluids but you should avoid alcoholic beverages for 24 hours.  ACTIVITY:  You should plan to take  it easy for the rest of today and you should NOT DRIVE or use heavy machinery until tomorrow (because of the sedation medicines used during the test).    FOLLOW UP: Our staff will call the number listed on your records the next business day following your procedure.  We will call around 7:15- 8:00 am to check on you and address any questions or concerns that you may have regarding the information given to you following your procedure. If we do not reach you, we will leave a message.     If any biopsies were taken you will be contacted by phone or by letter within the next 1-3 weeks.  Please call us  at [phone] if you have not heard about the biopsies in 3 weeks.    SIGNATURES/CONFIDENTIALITY: You and/or your care partner have signed paperwork which will be entered into your electronic medical record.  These signatures attest to the fact that that the information above on your After Visit Summary has been reviewed and is understood.  Full responsibility of the confidentiality of this discharge information lies with you and/or your care-partner.

## 2024-06-19 NOTE — Op Note (Addendum)
 [facility] Patient Name: Carolyn Watson Procedure Date: 06/19/2024 9:08 AM MRN: [id number] Endoscopist: Inocente Hausen , MD, [id number] Age: 43 Referring MD:  Date of Birth: [birth year] Gender: Female Account #: [id number] Procedure:                Colonoscopy Indications:              Screening for colorectal malignant neoplasm, This                            is the patient's first colonoscopy Medicines:                Monitored Anesthesia Care Procedure:                Pre-Anesthesia Assessment:                           - Prior to the procedure, a History and Physical                            was performed, and patient medications and                            allergies were reviewed. The patient's tolerance of                            previous anesthesia was also reviewed. The risks                            and benefits of the procedure and the sedation                            options and risks were discussed with the patient.                            All questions were answered, and informed consent                            was obtained. Prior Anticoagulants: The patient has                            taken no anticoagulant or antiplatelet agents. ASA                            Grade Assessment: II - A patient with mild systemic                            disease. After reviewing the risks and benefits,                            the patient was deemed in satisfactory condition to                            undergo the procedure.  After obtaining informed consent, the colonoscope                            was passed under direct vision. Throughout the                            procedure, the patient's blood pressure, pulse, and                            oxygen saturations were monitored continuously. The                            Olympus Scope [id number] was introduced through the                            anus and advanced to  the cecum, identified by                            appendiceal orifice and ileocecal valve. The                            colonoscopy was performed without difficulty. The                            patient tolerated the procedure well. The quality                            of the bowel preparation was good. The ileocecal                            valve, appendiceal orifice, and rectum were                            photographed. Scope In: 9:23:30 AM Scope Out: 9:38:50 AM Scope Withdrawal Time: 0 hours 10 minutes 39 seconds  Total Procedure Duration: 0 hours 15 minutes 20 seconds  Findings:                 The perianal and digital rectal examinations were                            normal. Pertinent negatives include normal                            sphincter tone and no palpable rectal lesions.                           A 4 mm polyp was found in the ascending colon. The                            polyp was sessile. The polyp was removed with a                            cold biopsy forceps. Resection and retrieval were  complete.                           Internal hemorrhoids were found during retroflexion. Complications:            No immediate complications. Estimated blood loss:                            Minimal. Estimated Blood Loss:     Estimated blood loss was minimal. Impression:               - One 4 mm polyp in the ascending colon, removed                            with a cold biopsy forceps. Resected and retrieved.                           - Internal hemorrhoids. Recommendation:           - Discharge patient to home (ambulatory).                           - Repeat colonoscopy for surveillance based on                            pathology results.                           - The findings and recommendations were discussed                            with the patient's family.                           - Return to referring physician.                            - Patient has a contact number available for                            emergencies. The signs and symptoms of potential                            delayed complications were discussed with the                            patient. Return to normal activities tomorrow.                            Written discharge instructions were provided to the                            patient. Inocente Hausen, MD 06/19/2024 9:41:47 AM This report has been signed electronically. Addendum Number: 1   Addendum Date: 06/19/2024 9:42:38 AM      Patient reports a family history of polyps in her father - high risk       colon cancer screening.  Inocente Hausen, MD 06/19/2024 9:43:11 AM This report has been signed electronically.

## 2024-06-19 NOTE — Progress Notes (Signed)
 Report to PACU, RN, vss, BBS= Clear.

## 2024-06-22 ENCOUNTER — Telehealth: Payer: Self-pay

## 2024-06-22 NOTE — Telephone Encounter (Signed)
  Follow up Call-     06/19/2024    8:36 AM  Call back number  Post procedure Call Back phone  # [phone]  Permission to leave phone message Yes     Patient questions:  Do you have a fever, pain , or abdominal swelling? No. Pain Score  0 *  Have you tolerated food without any problems? Yes.    Have you been able to return to your normal activities? Yes.    Do you have any questions about your discharge instructions: Diet   No. Medications  No. Follow up visit  No.  Do you have questions or concerns about your Care? No.  Actions: * If pain score is 4 or above: No action needed, pain <4.

## 2024-06-23 ENCOUNTER — Ambulatory Visit: Payer: Self-pay | Admitting: Pediatrics

## 2024-06-23 DIAGNOSIS — N912 Amenorrhea, unspecified: Secondary | ICD-10-CM | POA: Diagnosis not present

## 2024-06-23 LAB — SURGICAL PATHOLOGY

## 2024-08-05 DIAGNOSIS — Z780 Asymptomatic menopausal state: Secondary | ICD-10-CM | POA: Diagnosis not present
